# Patient Record
Sex: Female | Born: 1990 | Race: Black or African American | Hispanic: No | Marital: Single | Smoking: Never smoker
Health system: Southern US, Community
[De-identification: ages and names within clinical notes are randomized; demographics above are authoritative.]

## PROBLEM LIST (undated history)

## (undated) DIAGNOSIS — E785 Hyperlipidemia, unspecified: Secondary | ICD-10-CM

## (undated) DIAGNOSIS — K219 Gastro-esophageal reflux disease without esophagitis: Secondary | ICD-10-CM

## (undated) HISTORY — PX: HERNIA REPAIR: SHX51

## (undated) HISTORY — PX: WISDOM TOOTH EXTRACTION: SHX21

---

## 2011-03-28 LAB — ABO/RH: RH Type: POSITIVE

## 2011-03-28 LAB — GC/CHLAMYDIA PROBE AMP, GENITAL: Gonorrhea: POSITIVE

## 2011-03-28 LAB — RPR: RPR: NONREACTIVE

## 2011-03-28 LAB — HEPATITIS B SURFACE ANTIGEN: Hepatitis B Surface Ag: NEGATIVE

## 2011-08-09 NOTE — L&D Delivery Note (Signed)
Delivery Note Pushing started around 1815.  FHR w/ baseline in 150's, recurrent mild to moderate variables noted, and as continued w/ pushing, became moderate variables w/ late component.  Pt tilted from side to side and O2 on via mask.  Meconium appeared more particulate, where had been clear at time of rupture and then MSF when pushing started.  Pt pushed well w/ family at bedside to SVD at 7:03 PM a viable female "[place]" (Presentation: Right Occiput Anterior).  Compound presentation noted w/ newborn's right hand by face and umbilical cord around newborn's rt hand and wrist; reduced before body delivered fully. Newborn slow to transition despite drying and stimulation on mom's abdomen, so cord clamped soon after delivery and cut by pt's father and newborn taken to warmer for further assessment and pulse ox.   APGAR: 7, 8; weight 6 lb 5.2 oz (2870 g).   Placenta status: Intact, Spontaneous, Schultz, trailing membranes.  Cord: 3 vessels with the following complications: None.  Cord pH: 7.24 (venous)  Anesthesia: Epidural  Episiotomy: None Lacerations: 2nd degree;Periurethral;Sulcus Suture Repair: 3-0 monocryl for sulcus and 4-0 monocryl for periurethral Est. Blood Loss (mL):  Mom to postpartum.  Baby to nursery-stable. Pt tolerated repair well.  Encouraged breastfeeding.  Plans outpatient circumcision. Mehmet Scally H 09/01/2011, 7:40 AM

## 2011-08-30 ENCOUNTER — Inpatient Hospital Stay (HOSPITAL_COMMUNITY)
Admission: AD | Admit: 2011-08-30 | Discharge: 2011-09-02 | DRG: 775 | Disposition: A | Discharge status: Home / Self Care | Payer: 59 | Source: Ambulatory Visit | Attending: Obstetrics and Gynecology | Admitting: Obstetrics and Gynecology

## 2011-08-30 ENCOUNTER — Encounter (HOSPITAL_COMMUNITY): Payer: Self-pay

## 2011-08-30 DIAGNOSIS — O480 Post-term pregnancy: Secondary | ICD-10-CM | POA: Diagnosis present

## 2011-08-30 DIAGNOSIS — Z3490 Encounter for supervision of normal pregnancy, unspecified, unspecified trimester: Secondary | ICD-10-CM

## 2011-08-30 DIAGNOSIS — O4100X0 Oligohydramnios, unspecified trimester, not applicable or unspecified: Principal | ICD-10-CM | POA: Diagnosis present

## 2011-08-30 DIAGNOSIS — O328XX0 Maternal care for other malpresentation of fetus, not applicable or unspecified: Secondary | ICD-10-CM | POA: Diagnosis present

## 2011-08-30 HISTORY — DX: Gastro-esophageal reflux disease without esophagitis: K21.9

## 2011-08-30 HISTORY — DX: Hyperlipidemia, unspecified: E78.5

## 2011-08-30 LAB — CBC
Hemoglobin: 12.4 g/dL (ref 12.0–15.0)
MCH: 30.7 pg (ref 26.0–34.0)
MCV: 93.1 fL (ref 78.0–100.0)
Platelets: 241 10*3/uL (ref 150–400)
RBC: 4.04 MIL/uL (ref 3.87–5.11)
WBC: 16.5 10*3/uL — ABNORMAL HIGH (ref 4.0–10.5)

## 2011-08-30 MED ORDER — ZOLPIDEM TARTRATE 10 MG PO TABS: 10.0000 mg | ORAL_TABLET | Freq: Every evening | ORAL | Status: DC | PRN

## 2011-08-30 MED ORDER — IBUPROFEN 600 MG PO TABS: 600.0000 mg | ORAL_TABLET | Freq: Four times a day (QID) | ORAL | Status: DC | PRN

## 2011-08-30 MED ORDER — OXYTOCIN BOLUS FROM INFUSION
500.0000 mL | Freq: Once | INTRAVENOUS | Status: DC
  Filled 2011-08-30: qty 500

## 2011-08-30 MED ORDER — ACETAMINOPHEN 325 MG PO TABS: 650.0000 mg | ORAL_TABLET | ORAL | Status: DC | PRN

## 2011-08-30 MED ORDER — OXYTOCIN 20 UNITS IN LACTATED RINGERS INFUSION - SIMPLE: 125.0000 mL/h | Freq: Once | INTRAVENOUS | Status: DC

## 2011-08-30 MED ORDER — LIDOCAINE HCL (PF) 1 % IJ SOLN
30.0000 mL | INTRAMUSCULAR | Status: DC | PRN
  Filled 2011-08-30: qty 30

## 2011-08-30 MED ORDER — HYDROXYZINE HCL 50 MG PO TABS: 50.0000 mg | ORAL_TABLET | Freq: Four times a day (QID) | ORAL | Status: DC | PRN

## 2011-08-30 MED ORDER — OXYCODONE-ACETAMINOPHEN 5-325 MG PO TABS: 2.0000 | ORAL_TABLET | ORAL | Status: DC | PRN

## 2011-08-30 MED ORDER — SODIUM CHLORIDE 0.9 % IJ SOLN: 3.0000 mL | Freq: Two times a day (BID) | INTRAMUSCULAR | Status: DC

## 2011-08-30 MED ORDER — TERBUTALINE SULFATE 1 MG/ML IJ SOLN: 0.2500 mg | Freq: Once | INTRAMUSCULAR | Status: AC | PRN

## 2011-08-30 MED ORDER — SODIUM CHLORIDE 0.9 % IJ SOLN
3.0000 mL | INTRAMUSCULAR | Status: DC | PRN
  Administered 2011-08-30: 3 mL via INTRAVENOUS

## 2011-08-30 MED ORDER — CITRIC ACID-SODIUM CITRATE 334-500 MG/5ML PO SOLN: 30.0000 mL | ORAL | Status: DC | PRN

## 2011-08-30 MED ORDER — BUTORPHANOL TARTRATE 2 MG/ML IJ SOLN
1.0000 mg | INTRAMUSCULAR | Status: DC | PRN
  Administered 2011-08-31 (×3): 1 mg via INTRAVENOUS
  Filled 2011-08-30 (×3): qty 1

## 2011-08-30 MED ORDER — FLEET ENEMA 7-19 GM/118ML RE ENEM: 1.0000 | ENEMA | RECTAL | Status: DC | PRN

## 2011-08-30 MED ORDER — DINOPROSTONE 10 MG VA INST
10.0000 mg | VAGINAL_INSERT | Freq: Once | VAGINAL | Status: AC
  Administered 2011-08-30: 10 mg via VAGINAL
  Filled 2011-08-30: qty 1

## 2011-08-30 MED ORDER — SODIUM CHLORIDE 0.9 % IV SOLN: 250.0000 mL | INTRAVENOUS | Status: DC | PRN

## 2011-08-30 MED ORDER — LACTATED RINGERS IV SOLN
500.0000 mL | INTRAVENOUS | Status: DC | PRN
  Administered 2011-08-30: 500 mL via INTRAVENOUS
  Administered 2011-08-31: 300 mL via INTRAVENOUS
  Administered 2011-08-31: 1000 mL via INTRAVENOUS

## 2011-08-30 MED ORDER — ONDANSETRON HCL 4 MG/2ML IJ SOLN: 4.0000 mg | Freq: Four times a day (QID) | INTRAMUSCULAR | Status: DC | PRN

## 2011-08-30 MED ORDER — HYDROXYZINE HCL 50 MG/ML IM SOLN: 50.0000 mg | Freq: Four times a day (QID) | INTRAMUSCULAR | Status: DC | PRN

## 2011-08-30 NOTE — Progress Notes (Signed)
Traci Dodson is a 21 y.o. G1P0 at [redacted]w[redacted]d admitted for induction of labor due to Low amniotic fluid..  Subjective: Pt w/o complaints.  No h/o LOF.  No VB.  GFM.  Nml AFI on u/s last week at office.  AFI today=3.7 (<3%) and BPP 6/8 (-2 for decreased fluid).  Family at [facility].    Objective: BP 133/71  Pulse 83  Resp 20  Ht 5\' 11"  (1.803 m)  Wt 95.709 kg (211 lb)  BMI 29.43 kg/m2      FHT:  FHR: 140 bpm, variability: moderate,  accelerations:  Present,  decelerations:  Present occ'l mild variable UC:   Irregular ctxs SVE:   Dilation: 1 Effacement (%): 20 Station: -2 Exam by:: [employer]NC  Labs: No results found for this basename: WBC, HGB, HCT, MCV, PLT    Assessment / Plan: 1.  IOL secondary to oligo at 41.3 weeks  2.  GBS neg  3.  20y.o.  Labor: unfavorable cx w/ Bishop =3-4 Preeclampsia:  no signs or symptoms of toxicity Fetal Wellbeing:  Category I Pain Control:  Labor support without medications I/D:  n/a Anticipated MOD:  NSVD Will proceed w/ Cervidil currently.  Continuous monitoring.  C/w MD prn. Traci Dodson H 08/30/2011, 2:25 PM

## 2011-08-31 ENCOUNTER — Encounter (HOSPITAL_COMMUNITY): Payer: Self-pay | Admitting: Anesthesiology

## 2011-08-31 ENCOUNTER — Inpatient Hospital Stay (HOSPITAL_COMMUNITY): Payer: 59 | Admitting: Anesthesiology

## 2011-08-31 DIAGNOSIS — O98212 Gonorrhea complicating pregnancy, second trimester: Secondary | ICD-10-CM | POA: Insufficient documentation

## 2011-08-31 DIAGNOSIS — O480 Post-term pregnancy: Secondary | ICD-10-CM | POA: Diagnosis present

## 2011-08-31 DIAGNOSIS — Z3490 Encounter for supervision of normal pregnancy, unspecified, unspecified trimester: Secondary | ICD-10-CM

## 2011-08-31 DIAGNOSIS — Z8719 Personal history of other diseases of the digestive system: Secondary | ICD-10-CM | POA: Insufficient documentation

## 2011-08-31 DIAGNOSIS — O4100X0 Oligohydramnios, unspecified trimester, not applicable or unspecified: Secondary | ICD-10-CM | POA: Diagnosis present

## 2011-08-31 LAB — CBC
Hemoglobin: 12.8 g/dL (ref 12.0–15.0)
Platelets: 231 10*3/uL (ref 150–400)
RBC: 4.12 MIL/uL (ref 3.87–5.11)
WBC: 21.7 10*3/uL — ABNORMAL HIGH (ref 4.0–10.5)

## 2011-08-31 MED ORDER — FENTANYL 2.5 MCG/ML BUPIVACAINE 1/10 % EPIDURAL INFUSION (WH - ANES)
14.0000 mL/h | INTRAMUSCULAR | Status: DC
  Filled 2011-08-31: qty 60

## 2011-08-31 MED ORDER — BENZOCAINE-MENTHOL 20-0.5 % EX AERO
INHALATION_SPRAY | CUTANEOUS | Status: AC
  Administered 2011-09-01: 1 via TOPICAL
  Filled 2011-08-31: qty 56

## 2011-08-31 MED ORDER — EPHEDRINE 5 MG/ML INJ
10.0000 mg | INTRAVENOUS | Status: DC | PRN
  Filled 2011-08-31: qty 4

## 2011-08-31 MED ORDER — EPHEDRINE 5 MG/ML INJ: 10.0000 mg | INTRAVENOUS | Status: DC | PRN

## 2011-08-31 MED ORDER — METHYLERGONOVINE MALEATE 0.2 MG PO TABS: 0.2000 mg | ORAL_TABLET | ORAL | Status: DC | PRN

## 2011-08-31 MED ORDER — DIPHENHYDRAMINE HCL 25 MG PO CAPS: 25.0000 mg | ORAL_CAPSULE | Freq: Four times a day (QID) | ORAL | Status: DC | PRN

## 2011-08-31 MED ORDER — METHYLERGONOVINE MALEATE 0.2 MG/ML IJ SOLN: 0.2000 mg | INTRAMUSCULAR | Status: DC | PRN

## 2011-08-31 MED ORDER — OXYTOCIN 20 UNITS IN LACTATED RINGERS INFUSION - SIMPLE
1.0000 m[IU]/min | INTRAVENOUS | Status: DC
  Administered 2011-08-31: 1 m[IU]/min via INTRAVENOUS
  Administered 2011-08-31: 333 m[IU]/min via INTRAVENOUS
  Filled 2011-08-31: qty 1000

## 2011-08-31 MED ORDER — ZOLPIDEM TARTRATE 5 MG PO TABS: 5.0000 mg | ORAL_TABLET | Freq: Every evening | ORAL | Status: DC | PRN

## 2011-08-31 MED ORDER — DIBUCAINE 1 % RE OINT: 1.0000 "application " | TOPICAL_OINTMENT | RECTAL | Status: DC | PRN

## 2011-08-31 MED ORDER — FENTANYL 2.5 MCG/ML BUPIVACAINE 1/10 % EPIDURAL INFUSION (WH - ANES)
INTRAMUSCULAR | Status: DC | PRN
  Administered 2011-08-31: 14 mL/h via EPIDURAL

## 2011-08-31 MED ORDER — MAGNESIUM HYDROXIDE 400 MG/5ML PO SUSP: 30.0000 mL | ORAL | Status: DC | PRN

## 2011-08-31 MED ORDER — LANOLIN HYDROUS EX OINT: TOPICAL_OINTMENT | CUTANEOUS | Status: DC | PRN

## 2011-08-31 MED ORDER — WITCH HAZEL-GLYCERIN EX PADS: 1.0000 "application " | MEDICATED_PAD | CUTANEOUS | Status: DC | PRN

## 2011-08-31 MED ORDER — BENZOCAINE-MENTHOL 20-0.5 % EX AERO
1.0000 "application " | INHALATION_SPRAY | CUTANEOUS | Status: DC | PRN
  Administered 2011-09-01: 1 via TOPICAL

## 2011-08-31 MED ORDER — OXYCODONE-ACETAMINOPHEN 5-325 MG PO TABS: 1.0000 | ORAL_TABLET | ORAL | Status: DC | PRN

## 2011-08-31 MED ORDER — SODIUM BICARBONATE 8.4 % IV SOLN
INTRAVENOUS | Status: DC | PRN
  Administered 2011-08-31: 4 mL via EPIDURAL

## 2011-08-31 MED ORDER — IBUPROFEN 600 MG PO TABS
600.0000 mg | ORAL_TABLET | Freq: Four times a day (QID) | ORAL | Status: DC
  Administered 2011-08-31 – 2011-09-02 (×6): 600 mg via ORAL
  Filled 2011-08-31 (×6): qty 1

## 2011-08-31 MED ORDER — PHENYLEPHRINE 40 MCG/ML (10ML) SYRINGE FOR IV PUSH (FOR BLOOD PRESSURE SUPPORT)
80.0000 ug | PREFILLED_SYRINGE | INTRAVENOUS | Status: DC | PRN
  Filled 2011-08-31: qty 5

## 2011-08-31 MED ORDER — PHENYLEPHRINE 40 MCG/ML (10ML) SYRINGE FOR IV PUSH (FOR BLOOD PRESSURE SUPPORT): 80.0000 ug | PREFILLED_SYRINGE | INTRAVENOUS | Status: DC | PRN

## 2011-08-31 MED ORDER — SODIUM CHLORIDE 0.9 % IV SOLN
3.0000 g | Freq: Once | INTRAVENOUS | Status: AC
  Administered 2011-08-31: 3 g via INTRAVENOUS
  Filled 2011-08-31: qty 3

## 2011-08-31 MED ORDER — DIPHENHYDRAMINE HCL 50 MG/ML IJ SOLN: 12.5000 mg | INTRAMUSCULAR | Status: DC | PRN

## 2011-08-31 MED ORDER — ONDANSETRON HCL 4 MG/2ML IJ SOLN: 4.0000 mg | INTRAMUSCULAR | Status: DC | PRN

## 2011-08-31 MED ORDER — TETANUS-DIPHTH-ACELL PERTUSSIS 5-2.5-18.5 LF-MCG/0.5 IM SUSP: 0.5000 mL | Freq: Once | INTRAMUSCULAR | Status: DC

## 2011-08-31 MED ORDER — ONDANSETRON HCL 4 MG PO TABS: 4.0000 mg | ORAL_TABLET | ORAL | Status: DC | PRN

## 2011-08-31 MED ORDER — SIMETHICONE 80 MG PO CHEW: 80.0000 mg | CHEWABLE_TABLET | ORAL | Status: DC | PRN

## 2011-08-31 MED ORDER — SENNOSIDES-DOCUSATE SODIUM 8.6-50 MG PO TABS
2.0000 | ORAL_TABLET | Freq: Every day | ORAL | Status: DC
  Administered 2011-09-01: 2 via ORAL

## 2011-08-31 MED ORDER — POLYSACCHARIDE IRON 150 MG PO CAPS
150.0000 mg | ORAL_CAPSULE | Freq: Two times a day (BID) | ORAL | Status: DC
  Administered 2011-08-31 – 2011-09-02 (×3): 150 mg via ORAL
  Filled 2011-08-31 (×4): qty 1

## 2011-08-31 MED ORDER — MEDROXYPROGESTERONE ACETATE 150 MG/ML IM SUSP: 150.0000 mg | INTRAMUSCULAR | Status: DC | PRN

## 2011-08-31 MED ORDER — PRENATAL MULTIVITAMIN CH
1.0000 | ORAL_TABLET | Freq: Every day | ORAL | Status: DC
  Administered 2011-09-01 – 2011-09-02 (×2): 1 via ORAL
  Filled 2011-08-31 (×2): qty 1

## 2011-08-31 MED ORDER — LACTATED RINGERS IV SOLN
500.0000 mL | Freq: Once | INTRAVENOUS | Status: AC
  Administered 2011-08-31: 14:00:00 via INTRAVENOUS

## 2011-08-31 MED ORDER — DINOPROSTONE 10 MG VA INST
10.0000 mg | VAGINAL_INSERT | Freq: Once | VAGINAL | Status: AC
  Administered 2011-08-31: 10 mg via VAGINAL
  Filled 2011-08-31: qty 1

## 2011-08-31 NOTE — Anesthesia Preprocedure Evaluation (Signed)
Anesthesia Evaluation  Patient identified by MRN, date of birth, ID band Patient awake    Reviewed: Allergy & Precautions, H&P , Patient's Chart, lab work & pertinent test results  Airway Mallampati: II TM Distance: >3 FB Neck ROM: full    Dental  (+) Teeth Intact   Pulmonary  clear to auscultation        Cardiovascular regular Normal    Neuro/Psych    GI/Hepatic GERD-  ,  Endo/Other    Renal/GU      Musculoskeletal   Abdominal   Peds  Hematology   Anesthesia Other Findings       Reproductive/Obstetrics (+) Pregnancy                           Anesthesia Physical Anesthesia Plan  ASA: II  Anesthesia Plan: Epidural   Post-op Pain Management:    Induction:   Airway Management Planned:   Additional Equipment:   Intra-op Plan:   Post-operative Plan:   Informed Consent: I have reviewed the patients History and Physical, chart, labs and discussed the procedure including the risks, benefits and alternatives for the proposed anesthesia with the patient or authorized representative who has indicated his/her understanding and acceptance.   Dental Advisory Given  Plan Discussed with:   Anesthesia Plan Comments: (Labs checked- platelets confirmed with RN in room. Fetal heart tracing, per RN, reported to be stable enough for sitting procedure. Discussed epidural, and patient consents to the procedure:  included risk of possible headache,backache, failed block, allergic reaction, and nerve injury. This patient was asked if she had any questions or concerns before the procedure started. )        Anesthesia Quick Evaluation  

## 2011-08-31 NOTE — Progress Notes (Signed)
Subjective: Pt has received IV Stadol since 7am.  Denies LOF or VB.  Family remains at Bedside.  Tolerating ctxs well; no labored breathing or grimace currently.  Objective: BP 126/69  Pulse 94  Temp(Src) 98 F (36.7 C) (Oral)  Resp 16  Ht 5\' 11"  (1.803 m)  Wt 95.709 kg (211 lb)  BMI 29.43 kg/m2 I/O last 3 completed shifts: In: 3 [I.V.:3] Out: -     FHT:  FHR: 125 bpm, variability: moderate,  accelerations:  Present,  decelerations:  Present intermittent mild variables; some w/ late component UC:   irregular, every 2-6 minutes; consistent couplet pattern SVE:   Dilation: 3.5 Effacement (%): 90 Station: -2 Exam by:: [employer], cnm Cervidil removed; Membranes bulging  Assessment / Plan: 1.  41.4  2.  Early Labor  3.  GBS neg  4.  Oligo  5.  Int variables  Labor: Cervidil just removed; s/p 2 doses;  Preeclampsia:  no signs or symptoms of toxicity Fetal Wellbeing:  Category II Pain Control:  IV pain med x2 I/D:  n/a Anticipated MOD:  NSVD Will begin IV Pitocin at 38mu/min and titrate by 1 every 30-60 min.   CTO FHT closely. Epidural prn offered to pt. C/w MD prn. Jazline Cumbee H 08/31/2011, 12:29 PM

## 2011-08-31 NOTE — H&P (Signed)
Traci Dodson is a 21 y.o. single black female primagravida presenting for induction of labor at 41.3 weeks per [facility] 08/20/11 secondary to oligohydramnios.  Pt sent from office today after u/s showed AFI=3.77 (<3%) and BPP 6/8 (-2 for Oligo).  Pt had post-term u/s 08/23/11 and AFI at that time=9.49 (25%) and EFW=7+4 (48%).  Grade 3 placenta noted at that time.  Followed by CNM service at [facility].  Pt LTC, w/ onset around 19 weeks; anatomy u/s WNL and AUA consistent w/ LMP.  Pt's pregnancy has been overall unremarkable except positive Gonorrhea cx at time of onset of care and was treated w/ Rocephin.  Pt is not in relationship with FOB.  Lives w/ her mom and her family is very supportive.  Pt is finishing college.  GBS neg on 12/20 and TOC cx's and repeat cx's neg for gc/ct.  Pt denies any recent LOF or abnl d/c.  No VB, UTI, or PIH s/s.  Reports GFM.    Maternal Medical History:  Fetal activity: Perceived fetal activity is normal.   Last perceived fetal movement was within the past hour.    Prenatal complications: 1.  Late to Care 2.  2nd trimester gonorrhea 3.  H/o umb hernia repair as child 4.  Oligo dx'd 08/30/11    OB History    Grav Para Term Preterm Abortions TAB SAB Ect Mult Living   1              Past Medical History  Diagnosis Date  . Hyperlipemia     no meds  . GERD (gastroesophageal reflux disease)    Past Surgical History  Procedure Date  . Wisdom tooth extraction   . Hernia repair     in childhood   Family History: family history is not on file. Social History:  reports that she has never smoked. She does not have any smokeless tobacco history on file. She reports that she does not drink alcohol or use illicit drugs.  Review of Systems  Constitutional: Negative.   HENT: Negative.   Eyes: Negative.   Respiratory: Negative.   Cardiovascular: Negative.   Gastrointestinal: Negative.   Genitourinary: Negative.   Skin: Negative.   Neurological: Negative.     Dilation:  1 Effacement (%): Thick Station: -2 Exam by:: Davis,RN Blood pressure 121/65, pulse 89, temperature 98 F (36.7 C), temperature source Oral, resp. rate 16, height 5\' 11"  (1.803 m), weight 95.709 kg (211 lb). Maternal Exam:  Uterine Assessment: Contraction strength is mild.  Contraction frequency is irregular.   Abdomen: Patient reports no abdominal tenderness. Fetal presentation: vertex  Introitus: Normal vulva. Ferning test: not done.  Nitrazine test: not done.  Pelvis: adequate for delivery.   Cervix: Cervix evaluated by digital exam.     Fetal Exam Fetal Monitor Review: Mode: ultrasound.   Baseline rate: 140.  Variability: moderate (6-25 bpm).   Pattern: accelerations present and variable decelerations.    Fetal State Assessment: Category I - tracings are normal.     Physical Exam  Constitutional: She is oriented to person, place, and time. She appears well-developed and well-nourished. No distress.  Cardiovascular: Normal rate and regular rhythm.   Respiratory: Effort normal and breath sounds normal.  GI: Soft. Bowel sounds are normal.  Genitourinary:       cx on admission=tight 1cm/thick/-1 to -2; mid position membranes palpated  Neurological: She is alert and oriented to person, place, and time. She has normal reflexes.  Skin: Skin is warm and dry.  Prenatal labs: ABO, Rh: A/Positive/-- (08/20 0000) Antibody: Negative (08/20 0000) Rubella: Immune (08/20 0000) RPR: NON REACTIVE (01/22 1450)  HBsAg: Negative (08/20 0000)  HIV: Non-reactive (08/20 1407)  GBS: Negative (01/04 1406)  1hr gtt=126  Assessment/Plan: 1.  IUP at 41.3 2.  Oligohydramnios dx'd today on u/s 3.  GBS neg 4.  Cat I FHT 5.  Rare mild variable 6.  Unfavorable cx  1.  Admit to [facility] w/ Dr. Normand Sloop as attending 2.  Routine L&D orders 3.  Rec'd cervidil for ripening 4.  Pitocin/AROM prn augmentation 5.  C/w MD prn   Sevanna Ballengee H 08/31/2011, 11:12 AM

## 2011-08-31 NOTE — Progress Notes (Signed)
Traci Dodson is a 21 y.o. G1P0 at [redacted]w[redacted]d admitted for induction of labor due to Oligohydramnios.  Subjective: Pt awake watching tv; no breakfast yet.  Pt's mom at bedside.  Feels some ctxs, but no painful and no other c/o's.  Denies LOF, VB.  GFM reported.  2nd cervidil placed around 0230.    Objective: BP 121/65  Pulse 89  Temp(Src) 98 F (36.7 C) (Oral)  Resp 18  Ht 5\' 11"  (1.803 m)  Wt 95.709 kg (211 lb)  BMI 29.43 kg/m2 I/O last 3 completed shifts: In: 3 [I.V.:3] Out: -     FHT:  FHR: 130 bpm, variability: moderate,  accelerations:  Present,  decelerations:  Present occ'l mild variable UC:   irregular, every 2-5 minutes; some couplets/triplets SVE:   Dilation: 1 Effacement (%): Thick Station: -2 Exam by:: Traci Dodson  Labs: Lab Results  Component Value Date   WBC 16.5* 08/30/2011   HGB 12.4 08/30/2011   HCT 37.6 08/30/2011   MCV 93.1 08/30/2011   PLT 241 08/30/2011    Assessment / Plan: 1.  41.4  2.  Induction in progress for Oligo  3.  GBS neg  Labor: latent Preeclampsia:  no signs or symptoms of toxicity Fetal Wellbeing:  Category I Pain Control:  Labor support without medications I/D:  n/a Anticipated MOD:  NSVD 1.  Consider SVE around lunch, and if cx nicely effaced, will d/c Cervidil at that time and proceed w/ Pitocin, if still thick, will leave in place until 1430, and then start Pitocin 2.  Pt ok w/ that plan.   3.  C/w MD prn. Traci Dodson H 08/31/2011, 9:43 AM

## 2011-08-31 NOTE — Progress Notes (Signed)
Asked by pt's RN to call dr. Jean Rosenthal w/ anesthesia r/e pt's WBC.  Called Dr. Jean Rosenthal and discussed pt's status and he verbalized concern w/ Pt's WBC increasing from 16 yesterday to 21.7 today.  He stated he did not feel comfortable placing epidural without prophylactically administering a "broad-spectrum" antibiotic in case pt may potentially have an infection that could more easily travel into blood stream from epidural placement.   Discussed with Dr. Jean Rosenthal that the patient denies any s/s of infection, has been afebrile since admission, denies any resp or GI c/o's.  I did state that she is being induced for oligohydramnios and that if she truly has been leaking, although this hasn't been confirmed because of palpation of membranes on exam and pt denies any h/o leaking, the most plausible explanation if pt did have an infection currently, would be the presence of chorioamnionitis.  After phone conversation with Dr. Jean Rosenthal, consulted with Dr. Pennie Rushing and she rec'd  Unasyn 3gm IV x1 based on Dr. Edison Pace request to treat with antibiotic before agreeing to epidural placement.  In nurse's attendance, along with pt's family at bedside, patient consented for antibiotic therapy x 1 dose, despite no other s/s of infection and that treatment was recommendation of anesthesiologist.  Pt agreeable to proceed and does not have prior medication allergies, but understands potential of allergic reaction with any new presentation.

## 2011-08-31 NOTE — Progress Notes (Signed)
Patient ID: Traci Dodson, female   DOB: [birth year], 21 y.o.   MRN: [id number] .Subjective:  Has been resting, unaware of ctx  Objective: BP 121/59  Pulse 72  Temp(Src) 98.1 F (36.7 C) (Oral)  Resp 20  Ht 5\' 11"  (1.803 m)  Wt 95.709 kg (211 lb)  BMI 29.43 kg/m2   FHT:  FHR: 130 bpm, variability: moderate,  accelerations:  Present,  decelerations:  Present occ variable decels, resolve with position changes UC:   irregular, every 3-6 minutes SVE:   Dilation: 1 Effacement (%): 20 Station: -2 Exam by:: Kiara Keep, CNM    Assessment / Plan: Induction of labor due to oligo/postdates,  Cervical ripening Will place another cervidil and start pitocin later on today PRN   Fetal Wellbeing:  Category I Pain Control:  None needed  Update physician PRN  Malissa Hippo 08/31/2011, 2:59 AM

## 2011-08-31 NOTE — Anesthesia Procedure Notes (Signed)

## 2011-08-31 NOTE — Progress Notes (Signed)
Subjective: Comfortable s/p epidural.  Pitocin on 36mu/min.  Received epidural around 1530.  Parents in waiting room.    Objective: BP 139/84  Pulse 107  Temp(Src) 98.1 F (36.7 C) (Oral)  Resp 18  Ht 5\' 11"  (1.803 m)  Wt 95.709 kg (211 lb)  BMI 29.43 kg/m2 I/O last 3 completed shifts: In: 3 [I.V.:3] Out: -     FHT:  FHR: 140 bpm, variability: moderate,  accelerations:  Abscent,  decelerations:  Present intermittent mild to mod variables; some w/ late component UC:   regular, every 2-5 minutes; some still w/ couplet pattern SVE:   Dilation: 10 Effacement (%): 100 Station: -2 Exam by:: H, Charly Holcomb, cnm Clear LOF from introitus and perineum wet; sm amt of bloody show;  Forebag still noted Labs: Lab Results  Component Value Date   WBC 21.7* 08/31/2011   HGB 12.8 08/31/2011   HCT 38.2 08/31/2011   MCV 92.7 08/31/2011   PLT 231 08/31/2011   Assessment / Plan: 1.  Begin 2nd stage  2.  Now ruptured  3.  No urge to push s/p epidural  4.  GBS neg    Fetal Wellbeing:  Category II Pain Control:  Epidural I/D:  n/a Anticipated MOD:  NSVD Will labor down until urge to push, or 1-2 hrs if FHT WNL. Dr. Pennie Rushing updated.  Pt repositioned after Foley placed on oxygen on via mask. Fortunata Betty H 08/31/2011, 4:53 PM

## 2011-09-01 ENCOUNTER — Encounter (HOSPITAL_COMMUNITY): Payer: Self-pay | Admitting: *Deleted

## 2011-09-01 LAB — CBC
MCH: 31 pg (ref 26.0–34.0)
MCV: 93.5 fL (ref 78.0–100.0)
Platelets: 201 10*3/uL (ref 150–400)
RBC: 3.55 MIL/uL — ABNORMAL LOW (ref 3.87–5.11)
RDW: 13.1 % (ref 11.5–15.5)
WBC: 18.8 10*3/uL — ABNORMAL HIGH (ref 4.0–10.5)

## 2011-09-01 NOTE — Anesthesia Postprocedure Evaluation (Signed)
Anesthesia Post Note  Patient: Traci Dodson  Procedure(s) Performed: * No procedures listed *  Anesthesia type: Epidural  Patient location: Mother/Baby  Post pain: Pain level controlled  Post assessment: Post-op Vital signs reviewed  Last Vitals:  Filed Vitals:   09/01/11 1400  BP: 112/72  Pulse: 96  Temp: 36.8 C  Resp: 18    Post vital signs: Reviewed  Level of consciousness:alert  Complications: No apparent anesthesia complications

## 2011-09-02 MED ORDER — IBUPROFEN 600 MG PO TABS: 600.0000 mg | ORAL_TABLET | Freq: Four times a day (QID) | ORAL | Status: AC | PRN

## 2011-09-02 NOTE — Progress Notes (Addendum)
Post Partum Day 1 Subjective: no complaints.  Doing well, up ad lib.  Breast and bottlefeeding.  No syncope or dizziness.  Undecided regarding contraception.  Objective: Blood pressure 109/72, pulse 90, temperature 97.5 F (36.4 C), temperature source Oral, resp. rate 18, height 5\' 11"  (1.803 m), weight 95.709 kg (211 lb), SpO2 98.00%, unknown if currently breastfeeding.  Physical Exam:  General: alert Lochia: appropriate Uterine Fundus: firm Incision: healing well DVT Evaluation: No evidence of DVT seen on physical exam. Negative Homan's sign.   Basename 09/01/11 0553 08/31/11 1345  HGB 11.0* 12.8  HCT 33.2* 38.2    Assessment/Plan: Plan for discharge tomorrow. Reviewed contraceptive choices.   Nigel Bridgeman, CNM, [place] 09/01/11 10:30am

## 2011-09-02 NOTE — Progress Notes (Signed)
UR Chart review completed.  

## 2011-09-02 NOTE — Discharge Summary (Signed)
Obstetric Discharge Summary Reason for Admission: induction of labor for oligohydramnios and postdates Prenatal Procedures: NST and ultrasound Intrapartum Procedures: spontaneous vaginal delivery Postpartum Procedures: none Complications-Operative and Postpartum: 2nd degree perineal laceration Hemoglobin  Date Value Range Status  09/01/2011 11.0* 12.0-15.0 (g/dL) Final     HCT  Date Value Range Status  09/01/2011 33.2* 36.0-46.0 (%) Final   Hospital Course: Admitted 08/30/11 for induction due to post-dates and oligohydramnios. Negatie GBS. Cervidil was placed x 2, then pitocin was begun, with epidural for pain management.  Progressed  well. Delivery was performed by Denny Levy, CNM, without complication. Patient and baby tolerated the procedure without difficulty, with  2nd degree laceration noted. Infant to FTN. Mother and infant then had an uncomplicated postpartum course, with breast and bottle feeding going well. Mom's physical exam was WNL, and she was discharged home in stable condition. Contraception plan was undecided at the time of discharge.  She received adequate benefit from po pain medications, and was sent home with Ibuprophen.  Discharge Diagnoses: Post-date pregnancy and oligohydramnios, meconium stained fluid  Discharge Information: Date: 09/02/2011 Activity: Per [facility] handout Diet: routine Medications: Ibuprofen Condition: stable Instructions: refer to practice specific booklet Discharge to: home Contraception:  Undecided Follow-up Information    Follow up with [facility] in 6 weeks.         Newborn Data: Live born female  Birth Weight: 6 lb 5.2 oz (2870 g) APGAR: 7, 8  Home with mother.  Traci Dodson 09/02/2011, 7:42 AM

## 2011-10-05 ENCOUNTER — Ambulatory Visit (INDEPENDENT_AMBULATORY_CARE_PROVIDER_SITE_OTHER): Payer: 59 | Admitting: Obstetrics and Gynecology

## 2014-02-04 ENCOUNTER — Emergency Department (HOSPITAL_COMMUNITY)
Admission: EM | Admit: 2014-02-04 | Discharge: 2014-02-04 | Disposition: A | Discharge status: Home / Self Care | Payer: 59 | Attending: Emergency Medicine | Admitting: Emergency Medicine

## 2014-02-04 ENCOUNTER — Encounter (HOSPITAL_COMMUNITY): Payer: Self-pay | Admitting: Emergency Medicine

## 2014-02-04 DIAGNOSIS — Z3202 Encounter for pregnancy test, result negative: Secondary | ICD-10-CM | POA: Insufficient documentation

## 2014-02-04 DIAGNOSIS — R1013 Epigastric pain: Secondary | ICD-10-CM | POA: Insufficient documentation

## 2014-02-04 DIAGNOSIS — E785 Hyperlipidemia, unspecified: Secondary | ICD-10-CM | POA: Insufficient documentation

## 2014-02-04 DIAGNOSIS — F17200 Nicotine dependence, unspecified, uncomplicated: Secondary | ICD-10-CM | POA: Insufficient documentation

## 2014-02-04 DIAGNOSIS — Z8719 Personal history of other diseases of the digestive system: Secondary | ICD-10-CM | POA: Insufficient documentation

## 2014-02-04 DIAGNOSIS — Z79899 Other long term (current) drug therapy: Secondary | ICD-10-CM | POA: Insufficient documentation

## 2014-02-04 LAB — CBC WITH DIFFERENTIAL/PLATELET
Basophils Absolute: 0 10*3/uL (ref 0.0–0.1)
Basophils Relative: 0 % (ref 0–1)
EOS ABS: 0.5 10*3/uL (ref 0.0–0.7)
EOS PCT: 4 % (ref 0–5)
HCT: 39.6 % (ref 36.0–46.0)
HEMOGLOBIN: 13.2 g/dL (ref 12.0–15.0)
LYMPHS ABS: 4.8 10*3/uL — AB (ref 0.7–4.0)
Lymphocytes Relative: 42 % (ref 12–46)
MCH: 30.1 pg (ref 26.0–34.0)
MCHC: 33.3 g/dL (ref 30.0–36.0)
MCV: 90.4 fL (ref 78.0–100.0)
MONOS PCT: 4 % (ref 3–12)
Monocytes Absolute: 0.5 10*3/uL (ref 0.1–1.0)
Neutro Abs: 5.8 10*3/uL (ref 1.7–7.7)
Neutrophils Relative %: 50 % (ref 43–77)
PLATELETS: 342 10*3/uL (ref 150–400)
RBC: 4.38 MIL/uL (ref 3.87–5.11)
RDW: 12.5 % (ref 11.5–15.5)
WBC: 11.5 10*3/uL — ABNORMAL HIGH (ref 4.0–10.5)

## 2014-02-04 LAB — URINE MICROSCOPIC-ADD ON

## 2014-02-04 LAB — COMPREHENSIVE METABOLIC PANEL
ALT: 12 U/L (ref 0–35)
AST: 17 U/L (ref 0–37)
Albumin: 3.8 g/dL (ref 3.5–5.2)
Alkaline Phosphatase: 72 U/L (ref 39–117)
BUN: 11 mg/dL (ref 6–23)
CALCIUM: 9.2 mg/dL (ref 8.4–10.5)
CO2: 23 meq/L (ref 19–32)
CREATININE: 0.71 mg/dL (ref 0.50–1.10)
Chloride: 101 mEq/L (ref 96–112)
GLUCOSE: 117 mg/dL — AB (ref 70–99)
Potassium: 3.8 mEq/L (ref 3.7–5.3)
Sodium: 139 mEq/L (ref 137–147)
Total Bilirubin: 0.2 mg/dL — ABNORMAL LOW (ref 0.3–1.2)
Total Protein: 7.9 g/dL (ref 6.0–8.3)

## 2014-02-04 LAB — URINALYSIS, ROUTINE W REFLEX MICROSCOPIC
Bilirubin Urine: NEGATIVE
Glucose, UA: NEGATIVE mg/dL
Ketones, ur: NEGATIVE mg/dL
NITRITE: NEGATIVE
Protein, ur: NEGATIVE mg/dL
SPECIFIC GRAVITY, URINE: 1.028 (ref 1.005–1.030)
UROBILINOGEN UA: 0.2 mg/dL (ref 0.0–1.0)
pH: 5.5 (ref 5.0–8.0)

## 2014-02-04 LAB — LIPASE, BLOOD: Lipase: 30 U/L (ref 11–59)

## 2014-02-04 LAB — PREGNANCY, URINE: PREG TEST UR: NEGATIVE

## 2014-02-04 MED ORDER — FAMOTIDINE 20 MG PO TABS
20.0000 mg | ORAL_TABLET | Freq: Once | ORAL | Status: AC
  Administered 2014-02-04: 20 mg via ORAL
  Filled 2014-02-04: qty 1

## 2014-02-04 MED ORDER — FAMOTIDINE 20 MG PO TABS: 20.0000 mg | ORAL_TABLET | Freq: Two times a day (BID) | ORAL | Status: DC

## 2014-02-04 MED ORDER — GI COCKTAIL ~~LOC~~
30.0000 mL | Freq: Once | ORAL | Status: AC
  Administered 2014-02-04: 30 mL via ORAL
  Filled 2014-02-04: qty 30

## 2014-02-04 NOTE — ED Notes (Signed)
Patient presents with c/o upper abd pain.  Stated she was on her period and bleed for 4 days.  Then it stopped and came back in 2 days and it was just spotting.  States her pain is in her upper abd area.

## 2014-02-04 NOTE — Discharge Instructions (Signed)
Abdominal Pain, Women °Abdominal (stomach, pelvic, or belly) pain can be caused by many things. It is important to tell your doctor: °· The location of the pain. °· Does it come and go or is it present all the time? °· Are there things that start the pain (eating certain foods, exercise)? °· Are there other symptoms associated with the pain (fever, nausea, vomiting, diarrhea)? °All of this is helpful to know when trying to find the cause of the pain. °CAUSES  °· Stomach: virus or bacteria infection, or ulcer. °· Intestine: appendicitis (inflamed appendix), regional ileitis (Crohn's disease), ulcerative colitis (inflamed colon), irritable bowel syndrome, diverticulitis (inflamed diverticulum of the colon), or cancer of the stomach or intestine. °· Gallbladder disease or stones in the gallbladder. °· Kidney disease, kidney stones, or infection. °· Pancreas infection or cancer. °· Fibromyalgia (pain disorder). °· Diseases of the female organs: °¨ Uterus: fibroid (non-cancerous) tumors or infection. °¨ Fallopian tubes: infection or tubal pregnancy. °¨ Ovary: cysts or tumors. °¨ Pelvic adhesions (scar tissue). °¨ Endometriosis (uterus lining tissue growing in the pelvis and on the pelvic organs). °¨ Pelvic congestion syndrome (female organs filling up with blood just before the menstrual period). °¨ Pain with the menstrual period. °¨ Pain with ovulation (producing an egg). °¨ Pain with an IUD (intrauterine device, birth control) in the uterus. °¨ Cancer of the female organs. °· Functional pain (pain not caused by a disease, may improve without treatment). °· Psychological pain. °· Depression. °DIAGNOSIS  °Your doctor will decide the seriousness of your pain by doing an examination. °· Blood tests. °· X-rays. °· Ultrasound. °· CT scan (computed tomography, special type of X-ray). °· MRI (magnetic resonance imaging). °· Cultures, for infection. °· Barium enema (dye inserted in the large intestine, to better view it with  X-rays). °· Colonoscopy (looking in intestine with a lighted tube). °· Laparoscopy (minor surgery, looking in abdomen with a lighted tube). °· Major abdominal exploratory surgery (looking in abdomen with a large incision). °TREATMENT  °The treatment will depend on the cause of the pain.  °· Many cases can be observed and treated at home. °· Over-the-counter medicines recommended by your caregiver. °· Prescription medicine. °· Antibiotics, for infection. °· Birth control pills, for painful periods or for ovulation pain. °· Hormone treatment, for endometriosis. °· Nerve blocking injections. °· Physical therapy. °· Antidepressants. °· Counseling with a psychologist or psychiatrist. °· Minor or major surgery. °HOME CARE INSTRUCTIONS  °· Do not take laxatives, unless directed by your caregiver. °· Take over-the-counter pain medicine only if ordered by your caregiver. Do not take aspirin because it can cause an upset stomach or bleeding. °· Try a clear liquid diet (broth or water) as ordered by your caregiver. Slowly move to a bland diet, as tolerated, if the pain is related to the stomach or intestine. °· Have a thermometer and take your temperature several times a day, and record it. °· Bed rest and sleep, if it helps the pain. °· Avoid sexual intercourse, if it causes pain. °· Avoid stressful situations. °· Keep your follow-up appointments and tests, as your caregiver orders. °· If the pain does not go away with medicine or surgery, you may try: °¨ Acupuncture. °¨ Relaxation exercises (yoga, meditation). °¨ Group therapy. °¨ Counseling. °SEEK MEDICAL CARE IF:  °· You notice certain foods cause stomach pain. °· Your home care treatment is not helping your pain. °· You need stronger pain medicine. °· You want your IUD removed. °· You feel faint or   lightheaded. °· You develop nausea and vomiting. °· You develop a rash. °· You are having side effects or an allergy to your medicine. °SEEK IMMEDIATE MEDICAL CARE IF:  °· Your  pain does not go away or gets worse. °· You have a fever. °· Your pain is felt only in portions of the abdomen. The right side could possibly be appendicitis. The left lower portion of the abdomen could be colitis or diverticulitis. °· You are passing blood in your stools (bright red or black tarry stools, with or without vomiting). °· You have blood in your urine. °· You develop chills, with or without a fever. °· You pass out. °MAKE SURE YOU:  °· Understand these instructions. °· Will watch your condition. °· Will get help right away if you are not doing well or get worse. °Document Released: 05/22/2007 Document Revised: 10/17/2011 Document Reviewed: 06/11/2009 °[employer]® Patient Information ©2015 [employer]. This information is not intended to replace advice given to you by your health care provider. Make sure you discuss any questions you have with your health care provider. ° °

## 2014-02-04 NOTE — ED Provider Notes (Signed)
CSN: [id number]     Arrival date & time 02/04/14  0004 History   First MD Initiated Contact with Patient 02/04/14 0150     Chief Complaint  Patient presents with  . Abdominal Pain     (Consider location/radiation/quality/duration/timing/severity/associated sxs/prior Treatment) HPI  Patient presents with abdominal pain.  Patient states that she's been having sharp pains in her upper abdomen. They last seconds and come every 1-2 hours. They do not appear to be associated with food. Currently her pain is one out of 10. She reports her pain is in the epigastric region and nonradiating. She's had one episode of nonbloody diarrhea. She denies any nausea or vomiting. She denies any fevers. Last period was 4 days ago.  Past Medical History  Diagnosis Date  . Hyperlipemia     no meds  . GERD (gastroesophageal reflux disease)    Past Surgical History  Procedure Laterality Date  . Wisdom tooth extraction    . Hernia repair      in childhood   History reviewed. No pertinent family history. History  Substance Use Topics  . Smoking status: Current Some Day Smoker  . Smokeless tobacco: Never Used  . Alcohol Use: Yes   OB History   Grav Para Term Preterm Abortions TAB SAB Ect Mult Living   1 1 1       1      Review of Systems  Constitutional: Negative for fever.  Respiratory: Negative for chest tightness and shortness of breath.   Cardiovascular: Negative for chest pain.  Gastrointestinal: Positive for abdominal pain and diarrhea. Negative for nausea and vomiting.  Genitourinary: Negative for dysuria.  Musculoskeletal: Negative for back pain.  All other systems reviewed and are negative.     Allergies  Review of patient's allergies indicates no known allergies.  Home Medications   Prior to Admission medications   Medication Sig Start Date End Date Taking? Authorizing Provider  famotidine (PEPCID) 20 MG tablet Take 1 tablet (20 mg total) by mouth 2 (two) times daily. 02/04/14    Shon Batonourtney F Horton, MD   BP 105/46  Pulse 79  Temp(Src) 97.9 F (36.6 C) (Oral)  Resp 16  Ht 6' (1.829 m)  Wt 206 lb (93.441 kg)  BMI 27.93 kg/m2  SpO2 98%  LMP 01/25/2014  Breastfeeding? No Physical Exam  Nursing note and vitals reviewed. Constitutional: She is oriented to person, place, and time. She appears well-developed and well-nourished. No distress.  HENT:  Head: Normocephalic and atraumatic.  Cardiovascular: Normal rate, regular rhythm and normal heart sounds.   No murmur heard. Pulmonary/Chest: Effort normal and breath sounds normal. No respiratory distress. She has no wheezes.  Abdominal: Soft. Bowel sounds are normal. There is no tenderness. There is no rebound and no guarding.  Neurological: She is alert and oriented to person, place, and time.  Skin: Skin is warm and dry.  Psychiatric: She has a normal mood and affect.    ED Course  Procedures (including critical care time) Labs Review Labs Reviewed  CBC WITH DIFFERENTIAL - Abnormal; Notable for the following:    WBC 11.5 (*)    Lymphs Abs 4.8 (*)    All other components within normal limits  COMPREHENSIVE METABOLIC PANEL - Abnormal; Notable for the following:    Glucose, Bld 117 (*)    Total Bilirubin 0.2 (*)    All other components within normal limits  URINALYSIS, ROUTINE W REFLEX MICROSCOPIC - Abnormal; Notable for the following:    APPearance CLOUDY (*)  Hgb urine dipstick MODERATE (*)    Leukocytes, UA MODERATE (*)    All other components within normal limits  URINE MICROSCOPIC-ADD ON - Abnormal; Notable for the following:    Squamous Epithelial / LPF FEW (*)    Bacteria, UA FEW (*)    All other components within normal limits  LIPASE, BLOOD  PREGNANCY, URINE    Imaging Review No results found.   EKG Interpretation None      MDM   Final diagnoses:  Epigastric abdominal pain    Patient presents with upper abdominal pain. Currently she is well-appearing without significant  tenderness. Vital signs are reassuring. Pain is one out of 10.  Workup reviewed from triage and is largely unremarkable. Mild leukocytosis to 11.5. Patient was given a GI cocktail and Pepcid with complete resolution of pain. Given age and history as well as epigastric pain, feel this may be related to mild gastritis. Have low suspicion for other intra-abdominal pathology at this time and patient continues to be well appearing. Will begin patient on Pepcid at home.  After history, exam, and medical workup I feel the patient has been appropriately medically screened and is safe for discharge home. Pertinent diagnoses were discussed with the patient. Patient was given return precautions.     Shon Batonourtney F Horton, MD 02/04/14 [phone]

## 2014-02-04 NOTE — ED Notes (Signed)
Dressed self, steady gait.

## 2014-02-04 NOTE — ED Notes (Signed)
"  feels better, ready to go", (denies: pain, sob, nausea or dizziness).

## 2014-02-04 NOTE — ED Notes (Signed)
Pt alert, NAD, calm, interactive, no dyspnea noted, texting on smart phone. Updated about wait.

## 2014-06-09 ENCOUNTER — Encounter (HOSPITAL_COMMUNITY): Payer: Self-pay | Admitting: Emergency Medicine

## 2016-01-06 DIAGNOSIS — A749 Chlamydial infection, unspecified: Secondary | ICD-10-CM | POA: Insufficient documentation

## 2021-03-25 ENCOUNTER — Encounter: Payer: Self-pay | Admitting: Internal Medicine

## 2021-04-29 ENCOUNTER — Ambulatory Visit: Payer: 59 | Admitting: Physician Assistant

## 2021-05-24 NOTE — Progress Notes (Signed)
Traci Dodson is a 30 y.o. female here to establish care.   History of Present Illness:   Chief Complaint  Patient presents with  . Annual Exam   Acute Concerns: Abdominal wall hernia -- patient reports that she had abdominal hernia surgery repair when she was a child. Has had issues over the past year or two with worsening abdominal pain and bulge. Denies: nausea, vomiting, inability to reduce hernia.  Heavy periods -- long history of heavy periods x 9 years since the birth of her son. Goes through heavy maxi pads and overnight pads. Has to lay around house for at least two days during her periods due to pain and bleeding. Denies: lightheadedness, dizziness.   Chronic Issues: Acid Reflux Traci Dodson reports that she initially experienced acid reflux when she was pregnant with her now 23 year old son. She reports occasionally experiencing at night more often than during the day. Currently is not concerned with this issue.   Health Maintenance: Immunizations -- COVID- has at least two doses Influenza Vaccine- Never done Mammogram -- N/A PAP -- Due at this time. Bone Density -- N/A Diet -- Admits she is not eating well; intakes a lot of soda and eats all food groups. Caffeine intake -- Multiple cups of soda Sleep habits -- Lately experiencing better sleep. Ophthalmology- Up to date Dentistry- Still attempting to get in with a dentist Exercise -- Currently not exercising; Does want to lose at least 30 lbs. Weight -- Would like to get to 210lbs Wt Readings from Last 3 Encounters:  05/25/21 243 lb 4 oz (110.3 kg)  02/04/14 206 lb (93.4 kg)  08/30/11 211 lb (95.7 kg) (98 %, Z= 2.10)*   * Growth percentiles are based on CDC (Girls, 2-20 Years) data.    Mood -- Stable Alcohol -- Social Drinking Tobacco -- Never  Depression screen PHQ 2/9 05/25/2021  Decreased Interest 0  Down, Depressed, Hopeless 0  PHQ - 2 Score 0    No flowsheet data found.   Other  providers/specialists: Patient Care Team: Jarold Motto, [place] as PCP - General (Physician Assistant)   Past Medical History:  Diagnosis Date  . GERD (gastroesophageal reflux disease)   . Hyperlipemia    no meds  . Vaginal delivery    2013     Social History   Tobacco Use  . Smoking status: Never  . Smokeless tobacco: Never  Vaping Use  . Vaping Use: Never used  Substance Use Topics  . Alcohol use: Yes    Comment: socially  . Drug use: No    Past Surgical History:  Procedure Laterality Date  . HERNIA REPAIR     in childhood  . WISDOM TOOTH EXTRACTION      Family History  Problem Relation Age of Onset  . Diabetes Mother   . Diabetes Maternal Grandmother   . Diabetes Maternal Grandfather   . Hyperlipidemia Paternal Grandmother   . Breast cancer Neg Hx   . Colon cancer Neg Hx     No Known Allergies   Current Medications:  No current outpatient medications on file.   Review of Systems:   Review of Systems  Constitutional:  Negative for chills, fever, malaise/fatigue and weight loss.  HENT:  Negative for hearing loss, sinus pain and sore throat.   Respiratory:  Negative for cough and hemoptysis.   Cardiovascular:  Negative for chest pain, palpitations, leg swelling and PND.  Gastrointestinal:  Negative for abdominal pain, constipation, diarrhea, heartburn, nausea and vomiting.  Genitourinary:  Negative for dysuria, frequency and urgency.  Musculoskeletal:  Negative for back pain, myalgias and neck pain.  Skin:  Negative for itching and rash.  Neurological:  Negative for dizziness, tingling, seizures and headaches.  Endo/Heme/Allergies:  Negative for polydipsia.  Psychiatric/Behavioral:  Negative for depression. The patient is not nervous/anxious.     Vitals:   Vitals:   05/25/21 0800  BP: 110/76  Pulse: 78  Temp: 97.8 F (36.6 C)  TempSrc: Temporal  SpO2: 97%  Weight: 243 lb 4 oz (110.3 kg)  Height: 5' 11.5" (1.816 m)      Body mass index is  33.45 kg/m.  Physical Exam:   Physical Exam Vitals and nursing note reviewed.  Constitutional:      General: She is not in acute distress.    Appearance: Normal appearance. She is well-developed. She is not ill-appearing or toxic-appearing.  HENT:     Head: Normocephalic and atraumatic.     Right Ear: Tympanic membrane, ear canal and external ear normal. Tympanic membrane is not erythematous, retracted or bulging.     Left Ear: Tympanic membrane, ear canal and external ear normal. Tympanic membrane is not erythematous, retracted or bulging.  Eyes:     General: Lids are normal.     Conjunctiva/sclera: Conjunctivae normal.     Pupils: Pupils are equal, round, and reactive to light.  Neck:     Trachea: Trachea normal.  Cardiovascular:     Rate and Rhythm: Normal rate and regular rhythm.     Pulses: Normal pulses.     Heart sounds: Normal heart sounds, S1 normal and S2 normal.     Comments: No LE swelling Pulmonary:     Effort: Pulmonary effort is normal. No tachypnea or respiratory distress.     Breath sounds: Normal breath sounds. No decreased breath sounds, wheezing, rhonchi or rales.  Abdominal:     General: Bowel sounds are normal.     Palpations: Abdomen is soft.     Tenderness: There is no abdominal tenderness.    Genitourinary:    Labia:        Right: No rash, tenderness or lesion.        Left: No rash, tenderness or lesion.      Vagina: Normal. No foreign body. No vaginal discharge, erythema, tenderness or bleeding.     Cervix: No cervical motion tenderness, discharge or friability.     Adnexa:        Right: No mass or tenderness.         Left: No mass or tenderness.    Musculoskeletal:        General: Normal range of motion.     Cervical back: Full passive range of motion without pain.     Comments: Breast exam performed, no acute abnormalities seen on exam  Lymphadenopathy:     Cervical: No cervical adenopathy.  Skin:    General: Skin is warm and dry.   Neurological:     Mental Status: She is alert.     GCS: GCS eye subscore is 4. GCS verbal subscore is 5. GCS motor subscore is 6.     Cranial Nerves: No cranial nerve deficit.     Sensory: No sensory deficit.     Deep Tendon Reflexes: Reflexes are normal and symmetric.  Psychiatric:        Speech: Speech normal.        Behavior: Behavior normal. Behavior is cooperative.      Assessment and Plan:  Encounter for general adult medical examination with abnormal findings Today patient counseled on age appropriate routine health concerns for screening and prevention, each reviewed and up to date or declined. Immunizations reviewed and up to date or declined. Labs ordered and reviewed. Risk factors for depression reviewed and negative. Hearing function and visual acuity are intact. ADLs screened and addressed as needed. Functional ability and level of safety reviewed and appropriate. Education, counseling and referrals performed based on assessed risks today. Patient provided with a copy of personalized plan for preventive services.  Gastroesophageal reflux disease, unspecified whether esophagitis present Overall controlled Consider antacid medication if indicated  Abdominal hernia without obstruction and without gangrene, recurrence not specified, unspecified hernia type No red flags Referral for general surgery placed today Worsening precautions advised in the interim  Obesity, unspecified classification, unspecified obesity type, unspecified whether serious comorbidity present Discussed need for soda reduction Continue to encourage exercise  Menorrhagia with regular cycle Uncontrolled Will obtain pelvic ultrasound Consider COC's  Family history of diabetes mellitus Update A1c -- will provide recommendations accordingly Encouraged reduction of sodas  Pap smear for cervical cancer screening Update today  Encounter for screening for other viral diseases Update Hep C screening  today  I,Havlyn C Ratchford,acting as a scribe for [employer], PA.,have documented all relevant documentation on the behalf of Jarold Motto, PA,as directed by  Jarold Motto, PA while in the presence of Jarold Motto, [place].  I, Jarold Motto, [place], have reviewed all documentation for this visit. The documentation on 05/25/21 for the exam, diagnosis, procedures, and orders are all accurate and complete.   Jarold Motto, PA-C

## 2021-05-25 ENCOUNTER — Ambulatory Visit: Payer: No Typology Code available for payment source | Admitting: Physician Assistant

## 2021-05-25 ENCOUNTER — Other Ambulatory Visit (HOSPITAL_COMMUNITY)
Admission: RE | Admit: 2021-05-25 | Discharge: 2021-05-25 | Disposition: A | Discharge status: Home / Self Care | Payer: No Typology Code available for payment source | Source: Ambulatory Visit | Attending: Physician Assistant | Admitting: Physician Assistant

## 2021-05-25 ENCOUNTER — Other Ambulatory Visit: Payer: Self-pay

## 2021-05-25 ENCOUNTER — Encounter: Payer: Self-pay | Admitting: Physician Assistant

## 2021-05-25 VITALS — BP 110/76 | HR 78 | Temp 97.8°F | Ht 71.5 in | Wt 243.2 lb

## 2021-05-25 DIAGNOSIS — Z1159 Encounter for screening for other viral diseases: Secondary | ICD-10-CM

## 2021-05-25 DIAGNOSIS — Z124 Encounter for screening for malignant neoplasm of cervix: Secondary | ICD-10-CM | POA: Insufficient documentation

## 2021-05-25 DIAGNOSIS — Z833 Family history of diabetes mellitus: Secondary | ICD-10-CM

## 2021-05-25 DIAGNOSIS — Z0001 Encounter for general adult medical examination with abnormal findings: Secondary | ICD-10-CM

## 2021-05-25 DIAGNOSIS — E669 Obesity, unspecified: Secondary | ICD-10-CM

## 2021-05-25 DIAGNOSIS — K219 Gastro-esophageal reflux disease without esophagitis: Secondary | ICD-10-CM | POA: Diagnosis not present

## 2021-05-25 DIAGNOSIS — N920 Excessive and frequent menstruation with regular cycle: Secondary | ICD-10-CM

## 2021-05-25 DIAGNOSIS — K469 Unspecified abdominal hernia without obstruction or gangrene: Secondary | ICD-10-CM

## 2021-05-25 LAB — CBC WITH DIFFERENTIAL/PLATELET
Basophils Absolute: 0.1 10*3/uL (ref 0.0–0.1)
Basophils Relative: 1.1 % (ref 0.0–3.0)
Eosinophils Absolute: 0.3 10*3/uL (ref 0.0–0.7)
Eosinophils Relative: 2.9 % (ref 0.0–5.0)
HCT: 39.8 % (ref 36.0–46.0)
Hemoglobin: 13 g/dL (ref 12.0–15.0)
Lymphocytes Relative: 33.7 % (ref 12.0–46.0)
Lymphs Abs: 3.7 10*3/uL (ref 0.7–4.0)
MCHC: 32.6 g/dL (ref 30.0–36.0)
MCV: 89.1 fl (ref 78.0–100.0)
Monocytes Absolute: 0.6 10*3/uL (ref 0.1–1.0)
Monocytes Relative: 5.9 % (ref 3.0–12.0)
Neutro Abs: 6.1 10*3/uL (ref 1.4–7.7)
Neutrophils Relative %: 56.4 % (ref 43.0–77.0)
Platelets: 372 10*3/uL (ref 150.0–400.0)
RBC: 4.47 Mil/uL (ref 3.87–5.11)
RDW: 12.8 % (ref 11.5–15.5)
WBC: 10.9 10*3/uL — ABNORMAL HIGH (ref 4.0–10.5)

## 2021-05-25 LAB — LIPID PANEL
Cholesterol: 208 mg/dL — ABNORMAL HIGH (ref 0–200)
HDL: 44 mg/dL (ref >39.00)
LDL Cholesterol: 126 mg/dL — ABNORMAL HIGH (ref 0–99)
NonHDL: 164.1
Total CHOL/HDL Ratio: 5
Triglycerides: 193 mg/dL — ABNORMAL HIGH (ref 0.0–149.0)
VLDL: 38.6 mg/dL (ref 0.0–40.0)

## 2021-05-25 LAB — COMPREHENSIVE METABOLIC PANEL
ALT: 12 U/L (ref 0–35)
AST: 18 U/L (ref 0–37)
Albumin: 3.9 g/dL (ref 3.5–5.2)
Alkaline Phosphatase: 58 U/L (ref 39–117)
BUN: 9 mg/dL (ref 6–23)
CO2: 23 mEq/L (ref 19–32)
Calcium: 9.1 mg/dL (ref 8.4–10.5)
Chloride: 104 mEq/L (ref 96–112)
Creatinine, Ser: 0.81 mg/dL (ref 0.40–1.20)
GFR: 98.22 mL/min (ref >60.00)
Glucose, Bld: 87 mg/dL (ref 70–99)
Potassium: 4.3 mEq/L (ref 3.5–5.1)
Sodium: 137 mEq/L (ref 135–145)
Total Bilirubin: 0.3 mg/dL (ref 0.2–1.2)
Total Protein: 7.2 g/dL (ref 6.0–8.3)

## 2021-05-25 LAB — HEMOGLOBIN A1C: Hgb A1c MFr Bld: 5.5 % (ref 4.6–6.5)

## 2021-05-25 NOTE — Patient Instructions (Addendum)
It was great to see you!  Please call [facility] at your convenience to schedule your pelvic ultrasound: [phone]  After you have your ultrasound, we can discuss plan of making your periods better for you  Please call [facility] at your convenience to schedule consultation of your abdominal hernia: [phone]  Please go to the lab for blood work.   Our office will call you with your results unless you have chosen to receive results via MyChart.  If your blood work is normal we will follow-up each year for physicals and as scheduled for chronic medical problems.  If anything is abnormal we will treat accordingly and get you in for a follow-up.  Take care,  Lelon Mast

## 2021-05-26 LAB — IRON,TIBC AND FERRITIN PANEL
%SAT: 22 % (calc) (ref 16–45)
Ferritin: 52 ng/mL (ref 16–154)
Iron: 68 ug/dL (ref 40–190)
TIBC: 306 mcg/dL (calc) (ref 250–450)

## 2021-05-26 LAB — HEPATITIS C ANTIBODY
Hepatitis C Ab: NONREACTIVE
SIGNAL TO CUT-OFF: 0.05 (ref <1.00)

## 2021-05-27 LAB — CYTOLOGY - PAP: Diagnosis: NEGATIVE

## 2021-06-17 ENCOUNTER — Ambulatory Visit
Admission: RE | Admit: 2021-06-17 | Discharge: 2021-06-17 | Disposition: A | Discharge status: Home / Self Care | Payer: 59 | Source: Ambulatory Visit | Attending: Physician Assistant | Admitting: Physician Assistant

## 2021-06-17 DIAGNOSIS — N920 Excessive and frequent menstruation with regular cycle: Secondary | ICD-10-CM

## 2021-06-18 ENCOUNTER — Telehealth: Payer: Self-pay

## 2021-06-18 NOTE — Telephone Encounter (Signed)
Pt called regarding lab results. Please Advise.  

## 2021-06-21 NOTE — Telephone Encounter (Signed)
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

## 2021-06-21 NOTE — Telephone Encounter (Signed)
Left message on voicemail to call office.  

## 2021-06-23 ENCOUNTER — Other Ambulatory Visit: Payer: Self-pay | Admitting: *Deleted

## 2021-06-23 DIAGNOSIS — N8003 Adenomyosis of the uterus: Secondary | ICD-10-CM

## 2021-06-23 DIAGNOSIS — N939 Abnormal uterine and vaginal bleeding, unspecified: Secondary | ICD-10-CM

## 2021-06-23 NOTE — Telephone Encounter (Signed)
See result notes. 

## 2021-06-28 ENCOUNTER — Ambulatory Visit: Payer: 59 | Admitting: Physician Assistant

## 2021-06-30 ENCOUNTER — Other Ambulatory Visit: Payer: Self-pay | Admitting: Surgery

## 2021-06-30 DIAGNOSIS — K439 Ventral hernia without obstruction or gangrene: Secondary | ICD-10-CM

## 2021-07-29 ENCOUNTER — Ambulatory Visit
Admission: RE | Admit: 2021-07-29 | Discharge: 2021-07-29 | Disposition: A | Discharge status: Home / Self Care | Payer: 59 | Source: Ambulatory Visit | Attending: Surgery | Admitting: Surgery

## 2021-07-29 DIAGNOSIS — K439 Ventral hernia without obstruction or gangrene: Secondary | ICD-10-CM

## 2021-07-29 MED ORDER — IOPAMIDOL (ISOVUE-300) INJECTION 61%
100.0000 mL | Freq: Once | INTRAVENOUS | Status: AC | PRN
  Administered 2021-07-29: 11:00:00 100 mL via INTRAVENOUS

## 2021-10-02 ENCOUNTER — Other Ambulatory Visit: Payer: Self-pay

## 2021-10-02 ENCOUNTER — Encounter (HOSPITAL_BASED_OUTPATIENT_CLINIC_OR_DEPARTMENT_OTHER): Payer: Self-pay

## 2021-10-02 ENCOUNTER — Emergency Department (HOSPITAL_BASED_OUTPATIENT_CLINIC_OR_DEPARTMENT_OTHER)
Admission: EM | Admit: 2021-10-02 | Discharge: 2021-10-02 | Disposition: A | Discharge status: Home / Self Care | Payer: No Typology Code available for payment source | Attending: Emergency Medicine | Admitting: Emergency Medicine

## 2021-10-02 DIAGNOSIS — Z20822 Contact with and (suspected) exposure to covid-19: Secondary | ICD-10-CM | POA: Insufficient documentation

## 2021-10-02 DIAGNOSIS — J029 Acute pharyngitis, unspecified: Secondary | ICD-10-CM | POA: Diagnosis present

## 2021-10-02 DIAGNOSIS — R590 Localized enlarged lymph nodes: Secondary | ICD-10-CM | POA: Insufficient documentation

## 2021-10-02 LAB — RESP PANEL BY RT-PCR (FLU A&B, COVID) ARPGX2
Influenza A by PCR: NEGATIVE
Influenza B by PCR: NEGATIVE
SARS Coronavirus 2 by RT PCR: NEGATIVE

## 2021-10-02 LAB — GROUP A STREP BY PCR: Group A Strep by PCR: NOT DETECTED

## 2021-10-02 MED ORDER — DEXAMETHASONE 1 MG/ML PO CONC: 10.0000 mg | Freq: Once | ORAL | Status: DC

## 2021-10-02 MED ORDER — CLINDAMYCIN HCL 150 MG PO CAPS: 300.0000 mg | ORAL_CAPSULE | Freq: Three times a day (TID) | ORAL | 0 refills | Status: AC

## 2021-10-02 MED ORDER — DEXAMETHASONE 4 MG PO TABS
10.0000 mg | ORAL_TABLET | Freq: Once | ORAL | Status: AC
  Administered 2021-10-02: 10 mg via ORAL
  Filled 2021-10-02: qty 3

## 2021-10-02 NOTE — ED Notes (Signed)
Dc by Josh,Paramedic

## 2021-10-02 NOTE — ED Triage Notes (Signed)
Pt arrives POV with c/o sore throat x6 days.  Denies cough, fever, chills, body aches, nausea, vomiting, diarrhea.

## 2021-10-02 NOTE — Discharge Instructions (Addendum)
I have a concern that you have a bacterial infection in your throat. I have given you one dose of steroid here. This will help decrease the inflammation.  I have given you an antibiotic prescription to cover for a false negative strep test.  If you continue to worsen, please return to the ED.

## 2021-10-02 NOTE — ED Provider Notes (Signed)
[facility] EMERGENCY DEPT Provider Note   CSN: [id number] Arrival date & time: 10/02/21  1543     History  Chief Complaint  Patient presents with   Sore Throat    Traci Dodson is a 31 y.o. female. She complains of sore thoart. She states that it started on Sunday and was present for a couple of days. It seemed to improve on it's own, however two days ago, the sore throat started to get worse again. She states that it is painful to swallow. It feels like she has glass in her throat. Denies cough, fever, congestion, or neck swelling   Sore Throat      Home Medications Prior to Admission medications   Medication Sig Start Date End Date Taking? Authorizing Provider  clindamycin (CLEOCIN) 150 MG capsule Take 2 capsules (300 mg total) by mouth 3 (three) times daily for 10 days. 10/02/21 10/12/21 Yes Quantavious Eggert, Finis Bud, PA-C      Allergies    Patient has no known allergies.    Review of Systems   Review of Systems  Constitutional:  Negative for fever.  HENT:  Positive for sore throat. Negative for congestion.   Respiratory:  Negative for cough.   All other systems reviewed and are negative.  Physical Exam Updated Vital Signs BP 136/89    Pulse 78    Temp 98.8 F (37.1 C)    Resp 16    Ht 5\' 11"  (1.803 m)    Wt 108.9 kg    LMP 09/27/2021 (Exact Date)    SpO2 100%    BMI 33.47 kg/m  Physical Exam Vitals and nursing note reviewed.  Constitutional:      General: She is not in acute distress.    Appearance: Normal appearance. She is well-developed. She is not ill-appearing, toxic-appearing or diaphoretic.  HENT:     Head: Normocephalic and atraumatic.     Right Ear: Tympanic membrane and ear canal normal. No drainage, swelling or tenderness. No middle ear effusion. Tympanic membrane is not erythematous.     Left Ear: Tympanic membrane and ear canal normal. No drainage, swelling or tenderness.  No middle ear effusion. Tympanic membrane is not erythematous.      Nose: No nasal deformity, congestion or rhinorrhea.     Mouth/Throat:     Lips: Pink. No lesions.     Mouth: Mucous membranes are moist.     Pharynx: Uvula midline. Oropharyngeal exudate and posterior oropharyngeal erythema present.     Tonsils: Tonsillar exudate present. No tonsillar abscesses. 2+ on the right. 2+ on the left.  Eyes:     General: Gaze aligned appropriately. No scleral icterus.       Right eye: No discharge.        Left eye: No discharge.     Conjunctiva/sclera: Conjunctivae normal.     Right eye: Right conjunctiva is not injected. No exudate or hemorrhage.    Left eye: Left conjunctiva is not injected. No exudate or hemorrhage. Neck:     Comments: No neck swelling Pulmonary:     Effort: Pulmonary effort is normal. No respiratory distress.  Lymphadenopathy:     Cervical: Cervical adenopathy present.  Skin:    General: Skin is warm and dry.  Neurological:     Mental Status: She is alert and oriented to person, place, and time.  Psychiatric:        Mood and Affect: Mood normal.        Speech: Speech normal.  Behavior: Behavior normal. Behavior is cooperative.    ED Results / Procedures / Treatments   Labs (all labs ordered are listed, but only abnormal results are displayed) Labs Reviewed  RESP PANEL BY RT-PCR (FLU A&B, COVID) [id number]  GROUP A STREP BY PCR    EKG None  Radiology No results found.  Procedures Procedures   Medications Ordered in ED Medications  dexamethasone (DECADRON) tablet 10 mg (10 mg Oral Given 10/02/21 1841)    ED Course/ Medical Decision Making/ A&P Clinical Course as of 10/03/21 0047  Sat Oct 02, 2021  1813 Sor ethroat started Sunday. Got better and then came back two days ago. Difficulty swallowing. No fevers. No URI sx.  [GL]    Clinical Course User Index [GL] Demetrick Eichenberger C, PA-C                           Medical Decision Making Problems Addressed: Sore throat: acute illness or injury  Amount and/or  Complexity of Data Reviewed Labs: ordered. Decision-making details documented in ED Course.  Risk Prescription drug management.   This is a 30 y.o. female who presents to the ED with sore thoat for one week.   Initial Impression: Exam is with tonsillar exudates and posterior pharynx erythema. I dont see a PTA. Doubt RPA. Doubt other deep space infection. Plan to order strep and viral panel  Ddx:  Mononucleosis, Peritonsillar Abscess, Retropharyngeal Abscess, Strep throat, and Viral Pharyngitis   Additional History:  Additional history obtained from n/a External records from outside source obtained and reviewed including n/a Social Determinants of Health: n/a    I personally reviewed and interpreted all laboratory work and imaging . I agree with radiologist interpretation. Abnormal results outlined below.  Strep negative, covid/flu negative  Medications:   I ordered medication including decadron  for sore throat Reevaluation of the patient after these medicines showed that the patient stayed the same I have reviewed the patients home medicines and have made adjustments as needed  My Impression: I suspect this could be a bacterial cause. I think false negative strep test is possible. Given patient presentation will tx with abx ppx   Disposition:  After consideration of the diagnostic results and the patients response to treatment, I feel that the patent would benefit from abx.  Portions of this note were generated with Dragon dictation software. Dictation errors may occur despite best attempts at proofreading.   Final Clinical Impression(s) / ED Diagnoses Final diagnoses:  Sore throat    Rx / DC Orders ED Discharge Orders          Ordered    clindamycin (CLEOCIN) 150 MG capsule  3 times daily        02 /25/23 1818              Merryn Thaker, 03-01-2001 10/03/21 0047    10/05/21, MD 10/03/21 1501

## 2021-10-02 NOTE — ED Notes (Signed)
V/S at discharge as noted. Forms reviewed and understood by Pt.

## 2021-10-27 ENCOUNTER — Encounter (HOSPITAL_COMMUNITY): Payer: Self-pay

## 2021-10-27 ENCOUNTER — Encounter (HOSPITAL_COMMUNITY)
Admission: RE | Admit: 2021-10-27 | Discharge: 2021-10-27 | Disposition: A | Discharge status: Home / Self Care | Payer: No Typology Code available for payment source | Source: Ambulatory Visit | Attending: Surgery | Admitting: Surgery

## 2021-10-27 ENCOUNTER — Other Ambulatory Visit: Payer: Self-pay

## 2021-10-27 VITALS — BP 122/86 | HR 82 | Temp 98.4°F | Resp 17 | Ht 71.0 in | Wt 242.5 lb

## 2021-10-27 DIAGNOSIS — Z01812 Encounter for preprocedural laboratory examination: Secondary | ICD-10-CM | POA: Insufficient documentation

## 2021-10-27 DIAGNOSIS — Z01818 Encounter for other preprocedural examination: Secondary | ICD-10-CM

## 2021-10-27 LAB — CBC
HCT: 38.2 % (ref 36.0–46.0)
Hemoglobin: 12.5 g/dL (ref 12.0–15.0)
MCH: 29.8 pg (ref 26.0–34.0)
MCHC: 32.7 g/dL (ref 30.0–36.0)
MCV: 91.2 fL (ref 80.0–100.0)
Platelets: 386 10*3/uL (ref 150–400)
RBC: 4.19 MIL/uL (ref 3.87–5.11)
RDW: 12.4 % (ref 11.5–15.5)
WBC: 10.9 10*3/uL — ABNORMAL HIGH (ref 4.0–10.5)
nRBC: 0 % (ref 0.0–0.2)

## 2021-10-27 NOTE — Progress Notes (Signed)
PCP - Jarold Motto ?Cardiologist - denies ? ?Chest x-ray - n/a ?EKG - n/a ? ?ERAS Protcol - yes, no drink ordered or given ? ? ?COVID TEST- n/a ? ? ?Anesthesia review: n/a ? ?Patient denies shortness of breath, fever, cough and chest pain at PAT appointment ? ? ?All instructions explained to the patient, with a verbal understanding of the material. Patient agrees to go over the instructions while at home for a better understanding. Patient also instructed to self quarantine after being tested for COVID-19. The opportunity to ask questions was provided. ? ? ?

## 2021-10-27 NOTE — Progress Notes (Signed)
Surgical Instructions ? ? ? Your procedure is scheduled on Monday, April 3rd. ? Report to [facility] Main Entrance "A" at 5:30 A.M., then check in with the Admitting office. ? Call this number if you have problems the morning of surgery: ? [phone] ? ? If you have any questions prior to your surgery date call [phone]: Open Monday-Friday 8am-4pm ? ? ? Remember: ? Do not eat after midnight the night before your surgery ? ?You may drink clear liquids until 4:30 AM the morning of your surgery.   ?Clear liquids allowed are: Water, Non-Citrus Juices (without pulp), Carbonated Beverages, Clear Tea, Black Coffee ONLY (NO MILK, CREAM OR POWDERED CREAMER of any kind), and Gatorade ?  ? Take these medicines the morning of surgery with A SIP OF WATER:  ? NONE ? ? ?As of today, STOP taking any Aspirin (unless otherwise instructed by your surgeon) Aleve, Naproxen, Ibuprofen, Motrin, Advil, Goody's, BC's, all herbal medications, fish oil, and all vitamins. ? ?         ?Do not wear jewelry or makeup ?Do not wear lotions, powders, perfumes, or deodorant. ?Do not shave 48 hours prior to surgery.   ?Do not bring valuables to the hospital. ?Do not wear nail polish, gel polish, artificial nails, or any other type of covering on natural nails (fingers and toes) ?If you have artificial nails or gel coating that need to be removed by a nail salon, please have this removed prior to surgery. Artificial nails or gel coating may interfere with anesthesia's ability to adequately monitor your vital signs. ? ?[facility] is not responsible for any belongings or valuables. .  ? ?Do NOT Smoke (Tobacco/Vaping)  24 hours prior to your procedure ? ?If you use a CPAP at night, you may bring your mask for your overnight stay. ?  ?Contacts, glasses, hearing aids, dentures or partials may not be worn into surgery, please bring cases for these belongings ?  ?For patients admitted to the hospital, discharge time will be determined by your treatment  team. ?  ?Patients discharged the day of surgery will not be allowed to drive home, and someone needs to stay with them for 24 hours. ? ? ?SURGICAL WAITING ROOM VISITATION ?Patients having surgery or a procedure in a hospital may have two support people. ?Children under the age of 70 must have an adult with them who is not the patient. ?They may stay in the waiting area during the procedure and may switch out with other visitors. If the patient needs to stay at the hospital during part of their recovery, the visitor guidelines for inpatient rooms apply. ? ?Please refer to the [facility] [place] [facility] website for the visitor guidelines for Inpatients (after your surgery is over and you are in a regular room).  ? ? ?Special instructions:   ? ?Oral Hygiene is also important to reduce your risk of infection.  Remember - BRUSH YOUR TEETH THE MORNING OF SURGERY WITH YOUR REGULAR TOOTHPASTE ? ? ?[facility]- Preparing For Surgery ? ?Before surgery, you can play an important role. Because skin is not sterile, your skin needs to be as free of germs as possible. You can reduce the number of germs on your skin by washing with CHG (chlorahexidine gluconate) Soap before surgery.  CHG is an antiseptic cleaner which kills germs and bonds with the skin to continue killing germs even after washing.   ? ? ?Please do not use if you have an allergy to CHG or antibacterial soaps. If  your skin becomes reddened/irritated stop using the CHG.  ?Do not shave (including legs and underarms) for at least 48 hours prior to first CHG shower. It is OK to shave your face. ? ?Please follow these instructions carefully. ?  ? ? Shower the NIGHT BEFORE SURGERY and the MORNING OF SURGERY with CHG Soap.  ? If you chose to wash your hair, wash your hair first as usual with your normal shampoo. After you shampoo, rinse your hair and body thoroughly to remove the shampoo.  Then [employer] and genitals (private parts) with your normal soap and rinse thoroughly to remove  soap. ? ?After that Use CHG Soap as you would any other liquid soap. You can apply CHG directly to the skin and wash gently with a scrungie or a clean washcloth.  ? ?Apply the CHG Soap to your body ONLY FROM THE NECK DOWN.  Do not use on open wounds or open sores. Avoid contact with your eyes, ears, mouth and genitals (private parts). Wash Face and genitals (private parts)  with your normal soap.  ? ?Wash thoroughly, paying special attention to the area where your surgery will be performed. ? ?Thoroughly rinse your body with warm water from the neck down. ? ?DO NOT shower/wash with your normal soap after using and rinsing off the CHG Soap. ? ?Pat yourself dry with a CLEAN TOWEL. ? ?Wear CLEAN PAJAMAS to bed the night before surgery ? ?Place CLEAN SHEETS on your bed the night before your surgery ? ?DO NOT SLEEP WITH PETS. ? ? ?Day of Surgery: ? ?Take a shower with CHG soap. ?Wear Clean/Comfortable clothing the morning of surgery ?Do not apply any deodorants/lotions.   ?Remember to brush your teeth WITH YOUR REGULAR TOOTHPASTE. ? ? ?If you received a COVID test during your pre-op visit  it is requested that you wear a mask when out in public, stay away from anyone that may not be feeling well and notify your surgeon if you develop symptoms. If you have been in contact with anyone that has tested positive in the last 10 days please notify you surgeon. ? ?  ?Please read over the following fact sheets that you were given.  ? ?

## 2021-11-08 ENCOUNTER — Ambulatory Visit (HOSPITAL_COMMUNITY): Payer: No Typology Code available for payment source | Admitting: Anesthesiology

## 2021-11-08 ENCOUNTER — Ambulatory Visit (HOSPITAL_BASED_OUTPATIENT_CLINIC_OR_DEPARTMENT_OTHER): Payer: No Typology Code available for payment source | Admitting: Anesthesiology

## 2021-11-08 ENCOUNTER — Other Ambulatory Visit: Payer: Self-pay

## 2021-11-08 ENCOUNTER — Encounter (HOSPITAL_COMMUNITY): Payer: Self-pay | Admitting: Surgery

## 2021-11-08 ENCOUNTER — Ambulatory Visit (HOSPITAL_COMMUNITY)
Admission: RE | Admit: 2021-11-08 | Discharge: 2021-11-08 | Disposition: A | Discharge status: Home / Self Care | Payer: No Typology Code available for payment source | Source: Ambulatory Visit | Attending: Surgery | Admitting: Surgery

## 2021-11-08 ENCOUNTER — Encounter (HOSPITAL_COMMUNITY)
Admission: RE | Disposition: A | Discharge status: Home / Self Care | Payer: Self-pay | Source: Ambulatory Visit | Attending: Surgery

## 2021-11-08 DIAGNOSIS — K436 Other and unspecified ventral hernia with obstruction, without gangrene: Secondary | ICD-10-CM

## 2021-11-08 DIAGNOSIS — K219 Gastro-esophageal reflux disease without esophagitis: Secondary | ICD-10-CM | POA: Insufficient documentation

## 2021-11-08 HISTORY — PX: VENTRAL HERNIA REPAIR: SHX424

## 2021-11-08 LAB — POCT PREGNANCY, URINE: Preg Test, Ur: NEGATIVE

## 2021-11-08 SURGERY — REPAIR, HERNIA, VENTRAL, LAPAROSCOPIC
Anesthesia: General

## 2021-11-08 MED ORDER — METHOCARBAMOL 750 MG PO TABS: 750.0000 mg | ORAL_TABLET | Freq: Four times a day (QID) | ORAL | 1 refills | Status: DC

## 2021-11-08 MED ORDER — CHLORHEXIDINE GLUCONATE 0.12 % MT SOLN
15.0000 mL | Freq: Once | OROMUCOSAL | Status: AC
  Administered 2021-11-08: 15 mL via OROMUCOSAL
  Filled 2021-11-08: qty 15

## 2021-11-08 MED ORDER — OXYCODONE HCL 5 MG PO TABS
ORAL_TABLET | ORAL | Status: DC
Start: 2021-11-08 — End: 2021-11-08
  Filled 2021-11-08: qty 1

## 2021-11-08 MED ORDER — FENTANYL CITRATE (PF) 250 MCG/5ML IJ SOLN
INTRAMUSCULAR | Status: DC | PRN
Start: 2021-11-08 — End: 2021-11-08
  Administered 2021-11-08: 50 ug via INTRAVENOUS
  Administered 2021-11-08: 100 ug via INTRAVENOUS
  Administered 2021-11-08 (×2): 50 ug via INTRAVENOUS

## 2021-11-08 MED ORDER — ROCURONIUM BROMIDE 10 MG/ML (PF) SYRINGE
PREFILLED_SYRINGE | INTRAVENOUS | Status: DC | PRN
  Administered 2021-11-08: 10 mg via INTRAVENOUS
  Administered 2021-11-08: 70 mg via INTRAVENOUS

## 2021-11-08 MED ORDER — OXYCODONE HCL 5 MG PO TABS
5.0000 mg | ORAL_TABLET | Freq: Once | ORAL | Status: AC
  Administered 2021-11-08: 5 mg via ORAL

## 2021-11-08 MED ORDER — ONDANSETRON HCL 4 MG/2ML IJ SOLN: 4.0000 mg | Freq: Once | INTRAMUSCULAR | Status: DC | PRN

## 2021-11-08 MED ORDER — HYDROMORPHONE HCL 1 MG/ML IJ SOLN
INTRAMUSCULAR | Status: AC
  Filled 2021-11-08: qty 1

## 2021-11-08 MED ORDER — ROCURONIUM BROMIDE 10 MG/ML (PF) SYRINGE: PREFILLED_SYRINGE | INTRAVENOUS | Status: DC | PRN

## 2021-11-08 MED ORDER — PHENYLEPHRINE 40 MCG/ML (10ML) SYRINGE FOR IV PUSH (FOR BLOOD PRESSURE SUPPORT)
PREFILLED_SYRINGE | INTRAVENOUS | Status: AC
  Filled 2021-11-08: qty 10

## 2021-11-08 MED ORDER — KETOROLAC TROMETHAMINE 30 MG/ML IJ SOLN
30.0000 mg | Freq: Once | INTRAMUSCULAR | Status: AC | PRN
  Administered 2021-11-08: 30 mg via INTRAVENOUS

## 2021-11-08 MED ORDER — PROPOFOL 10 MG/ML IV BOLUS
INTRAVENOUS | Status: AC
  Filled 2021-11-08: qty 20

## 2021-11-08 MED ORDER — DEXAMETHASONE SODIUM PHOSPHATE 10 MG/ML IJ SOLN
INTRAMUSCULAR | Status: AC
  Filled 2021-11-08: qty 1

## 2021-11-08 MED ORDER — LACTATED RINGERS IV SOLN: INTRAVENOUS | Status: DC

## 2021-11-08 MED ORDER — LIDOCAINE 2% (20 MG/ML) 5 ML SYRINGE
INTRAMUSCULAR | Status: DC | PRN
  Administered 2021-11-08: 100 mg via INTRAVENOUS

## 2021-11-08 MED ORDER — ACETAMINOPHEN 10 MG/ML IV SOLN
INTRAVENOUS | Status: DC | PRN
  Administered 2021-11-08: 1000 mg via INTRAVENOUS

## 2021-11-08 MED ORDER — SUGAMMADEX SODIUM 200 MG/2ML IV SOLN
INTRAVENOUS | Status: DC | PRN
  Administered 2021-11-08: 200 mg via INTRAVENOUS

## 2021-11-08 MED ORDER — DOCUSATE SODIUM 100 MG PO CAPS: 100.0000 mg | ORAL_CAPSULE | Freq: Two times a day (BID) | ORAL | 2 refills | Status: AC

## 2021-11-08 MED ORDER — KETOROLAC TROMETHAMINE 30 MG/ML IJ SOLN
INTRAMUSCULAR | Status: AC
  Filled 2021-11-08: qty 1

## 2021-11-08 MED ORDER — MIDAZOLAM HCL 2 MG/2ML IJ SOLN
INTRAMUSCULAR | Status: DC | PRN
  Administered 2021-11-08: 2 mg via INTRAVENOUS

## 2021-11-08 MED ORDER — OXYCODONE HCL 5 MG PO TABS: 5.0000 mg | ORAL_TABLET | ORAL | 0 refills | Status: DC | PRN

## 2021-11-08 MED ORDER — HYDROMORPHONE HCL 1 MG/ML IJ SOLN
0.2500 mg | INTRAMUSCULAR | Status: DC | PRN
  Administered 2021-11-08: 0.25 mg via INTRAVENOUS

## 2021-11-08 MED ORDER — 0.9 % SODIUM CHLORIDE (POUR BTL) OPTIME
TOPICAL | Status: DC | PRN
  Administered 2021-11-08: 1000 mL

## 2021-11-08 MED ORDER — CEFAZOLIN SODIUM-DEXTROSE 2-4 GM/100ML-% IV SOLN
INTRAVENOUS | Status: AC
  Filled 2021-11-08: qty 100

## 2021-11-08 MED ORDER — DROPERIDOL 2.5 MG/ML IJ SOLN
INTRAMUSCULAR | Status: DC | PRN
  Administered 2021-11-08: .625 mg via INTRAVENOUS

## 2021-11-08 MED ORDER — ROCURONIUM BROMIDE 10 MG/ML (PF) SYRINGE
PREFILLED_SYRINGE | INTRAVENOUS | Status: AC
  Filled 2021-11-08: qty 10

## 2021-11-08 MED ORDER — EPHEDRINE 5 MG/ML INJ
INTRAVENOUS | Status: AC
  Filled 2021-11-08: qty 5

## 2021-11-08 MED ORDER — MIDAZOLAM HCL 2 MG/2ML IJ SOLN
INTRAMUSCULAR | Status: AC
  Filled 2021-11-08: qty 2

## 2021-11-08 MED ORDER — ORAL CARE MOUTH RINSE: 15.0000 mL | Freq: Once | OROMUCOSAL | Status: AC

## 2021-11-08 MED ORDER — CEFAZOLIN SODIUM-DEXTROSE 2-3 GM-%(50ML) IV SOLR
INTRAVENOUS | Status: DC | PRN
  Administered 2021-11-08: 2 g via INTRAVENOUS

## 2021-11-08 MED ORDER — LIDOCAINE 2% (20 MG/ML) 5 ML SYRINGE
INTRAMUSCULAR | Status: AC
  Filled 2021-11-08: qty 5

## 2021-11-08 MED ORDER — FENTANYL CITRATE (PF) 250 MCG/5ML IJ SOLN
INTRAMUSCULAR | Status: AC
  Filled 2021-11-08: qty 5

## 2021-11-08 MED ORDER — ACETAMINOPHEN 500 MG PO TABS: 1000.0000 mg | ORAL_TABLET | Freq: Four times a day (QID) | ORAL | 3 refills | Status: AC | PRN

## 2021-11-08 MED ORDER — PROPOFOL 10 MG/ML IV BOLUS
INTRAVENOUS | Status: DC | PRN
  Administered 2021-11-08: 200 mg via INTRAVENOUS
  Administered 2021-11-08: 80 mg via INTRAVENOUS

## 2021-11-08 MED ORDER — DEXAMETHASONE SODIUM PHOSPHATE 10 MG/ML IJ SOLN
INTRAMUSCULAR | Status: DC | PRN
Start: 2021-11-08 — End: 2021-11-08
  Administered 2021-11-08: 10 mg via INTRAVENOUS

## 2021-11-08 MED ORDER — IBUPROFEN 600 MG PO TABS: 600.0000 mg | ORAL_TABLET | Freq: Four times a day (QID) | ORAL | 1 refills | Status: AC | PRN

## 2021-11-08 MED ORDER — PHENYLEPHRINE 40 MCG/ML (10ML) SYRINGE FOR IV PUSH (FOR BLOOD PRESSURE SUPPORT)
PREFILLED_SYRINGE | INTRAVENOUS | Status: DC | PRN
  Administered 2021-11-08: 80 ug via INTRAVENOUS
  Administered 2021-11-08 (×4): 40 ug via INTRAVENOUS
  Administered 2021-11-08 (×2): 80 ug via INTRAVENOUS
  Administered 2021-11-08: 40 ug via INTRAVENOUS
  Administered 2021-11-08: 80 ug via INTRAVENOUS
  Administered 2021-11-08: 40 ug via INTRAVENOUS
  Administered 2021-11-08 (×3): 80 ug via INTRAVENOUS
  Administered 2021-11-08: 40 ug via INTRAVENOUS

## 2021-11-08 MED ORDER — BUPIVACAINE-EPINEPHRINE 0.25% -1:200000 IJ SOLN
INTRAMUSCULAR | Status: DC | PRN
  Administered 2021-11-08: 30 mL

## 2021-11-08 MED ORDER — ONDANSETRON HCL 4 MG/2ML IJ SOLN
INTRAMUSCULAR | Status: AC
  Filled 2021-11-08: qty 2

## 2021-11-08 MED ORDER — ONDANSETRON HCL 4 MG/2ML IJ SOLN
INTRAMUSCULAR | Status: DC | PRN
  Administered 2021-11-08: 4 mg via INTRAVENOUS

## 2021-11-08 MED ORDER — ACETAMINOPHEN 10 MG/ML IV SOLN
INTRAVENOUS | Status: AC
  Filled 2021-11-08: qty 100

## 2021-11-08 MED ORDER — BUPIVACAINE-EPINEPHRINE (PF) 0.25% -1:200000 IJ SOLN
INTRAMUSCULAR | Status: AC
  Filled 2021-11-08: qty 30

## 2021-11-08 SURGICAL SUPPLY — 51 items
ADH SKN CLS APL DERMABOND .7 (GAUZE/BANDAGES/DRESSINGS) ×1
ADH SKN CLS LQ APL DERMABOND (GAUZE/BANDAGES/DRESSINGS) ×1
APL PRP STRL LF DISP 70% ISPRP (MISCELLANEOUS) ×1
BAG COUNTER SPONGE SURGICOUNT (BAG) ×2 IMPLANT
BAG SPNG CNTER NS LX DISP (BAG) ×1
BINDER ABDOMINAL 12 ML 46-62 (SOFTGOODS) IMPLANT
CANISTER SUCT 3000ML PPV (MISCELLANEOUS) IMPLANT
CHLORAPREP W/TINT 26 (MISCELLANEOUS) ×2 IMPLANT
COVER SURGICAL LIGHT HANDLE (MISCELLANEOUS) ×2 IMPLANT
DERMABOND ADHESIVE PROPEN (GAUZE/BANDAGES/DRESSINGS) ×1
DERMABOND ADVANCED (GAUZE/BANDAGES/DRESSINGS) ×1
DERMABOND ADVANCED .7 DNX12 (GAUZE/BANDAGES/DRESSINGS) ×1 IMPLANT
DERMABOND ADVANCED .7 DNX6 (GAUZE/BANDAGES/DRESSINGS) IMPLANT
DEVICE SECURE STRAP 25 ABSORB (INSTRUMENTS) ×3 IMPLANT
ELECT CAUTERY BLADE 6.4 (BLADE) ×2 IMPLANT
ELECT REM PT RETURN 9FT ADLT (ELECTROSURGICAL) ×2
ELECTRODE REM PT RTRN 9FT ADLT (ELECTROSURGICAL) ×1 IMPLANT
GLOVE SURG ENC MOIS LTX SZ6.5 (GLOVE) ×2 IMPLANT
GLOVE SURG UNDER POLY LF SZ6 (GLOVE) ×2 IMPLANT
GOWN STRL REUS W/ TWL LRG LVL3 (GOWN DISPOSABLE) ×3 IMPLANT
GOWN STRL REUS W/TWL LRG LVL3 (GOWN DISPOSABLE) ×6
GRASPER SUT TROCAR 14GX15 (MISCELLANEOUS) ×2 IMPLANT
KIT BASIN OR (CUSTOM PROCEDURE TRAY) ×2 IMPLANT
KIT TURNOVER KIT B (KITS) ×2 IMPLANT
MARKER SKIN DUAL TIP RULER LAB (MISCELLANEOUS) ×2 IMPLANT
MESH VENTRALIGHT ST 4X6IN (Mesh General) ×1 IMPLANT
NDL INSUFFLATION 14GA 120MM (NEEDLE) ×1 IMPLANT
NDL SPNL 22GX3.5 QUINCKE BK (NEEDLE) ×1 IMPLANT
NEEDLE INSUFFLATION 14GA 120MM (NEEDLE) ×2 IMPLANT
NEEDLE SPNL 22GX3.5 QUINCKE BK (NEEDLE) ×2 IMPLANT
NS IRRIG 1000ML POUR BTL (IV SOLUTION) ×2 IMPLANT
PAD ARMBOARD 7.5X6 YLW CONV (MISCELLANEOUS) ×4 IMPLANT
PENCIL BUTTON HOLSTER BLD 10FT (ELECTRODE) ×2 IMPLANT
SCISSORS LAP 5X35 DISP (ENDOMECHANICALS) IMPLANT
SET IRRIG TUBING LAPAROSCOPIC (IRRIGATION / IRRIGATOR) IMPLANT
SET TUBE SMOKE EVAC HIGH FLOW (TUBING) ×2 IMPLANT
SHEARS HARMONIC ACE PLUS 36CM (ENDOMECHANICALS) IMPLANT
SLEEVE ENDOPATH XCEL 5M (ENDOMECHANICALS) ×4 IMPLANT
SUT ETHIBOND 0 MO6 C/R (SUTURE) ×1 IMPLANT
SUT MNCRL AB 4-0 PS2 18 (SUTURE) ×2 IMPLANT
SUT NOVA 1 T20/GS 25DT (SUTURE) ×2 IMPLANT
SUT VIC AB 3-0 SH 27 (SUTURE)
SUT VIC AB 3-0 SH 27XBRD (SUTURE) IMPLANT
TOWEL GREEN STERILE (TOWEL DISPOSABLE) ×2 IMPLANT
TOWEL GREEN STERILE FF (TOWEL DISPOSABLE) ×2 IMPLANT
TRAY FOLEY W/BAG SLVR 16FR (SET/KITS/TRAYS/PACK)
TRAY FOLEY W/BAG SLVR 16FR ST (SET/KITS/TRAYS/PACK) IMPLANT
TRAY LAPAROSCOPIC MC (CUSTOM PROCEDURE TRAY) ×2 IMPLANT
TROCAR XCEL NON-BLD 11X100MML (ENDOMECHANICALS) IMPLANT
TROCAR XCEL NON-BLD 5MMX100MML (ENDOMECHANICALS) ×2 IMPLANT
WATER STERILE IRR 1000ML POUR (IV SOLUTION) ×2 IMPLANT

## 2021-11-08 NOTE — Anesthesia Preprocedure Evaluation (Signed)
Anesthesia Evaluation  ?Patient identified by MRN, date of birth, ID band ?Patient awake ? ? ? ?Reviewed: ?Allergy & Precautions, NPO status , Patient's Chart, lab work & pertinent test results ? ?Airway ?Mallampati: II ? ?TM Distance: >3 FB ?Neck ROM: Full ? ? ? Dental ?no notable dental hx. ? ?  ?Pulmonary ?neg pulmonary ROS,  ?  ?Pulmonary exam normal ?breath sounds clear to auscultation ? ? ? ? ? ? Cardiovascular ?negative cardio ROS ?Normal cardiovascular exam ?Rhythm:Regular Rate:Normal ? ? ?  ?Neuro/Psych ?negative neurological ROS ? negative psych ROS  ? GI/Hepatic ?Neg liver ROS, GERD  ,  ?Endo/Other  ?negative endocrine ROS ? Renal/GU ?negative Renal ROS  ?negative genitourinary ?  ?Musculoskeletal ?negative musculoskeletal ROS ?(+)  ? Abdominal ?  ?Peds ?negative pediatric ROS ?(+)  Hematology ?negative hematology ROS ?(+)   ?Anesthesia Other Findings ? ? Reproductive/Obstetrics ?negative OB ROS ? ?  ? ? ? ? ? ? ? ? ? ? ? ? ? ?  ?  ? ? ? ? ? ? ? ? ?Anesthesia Physical ?Anesthesia Plan ? ?ASA: 2 ? ?Anesthesia Plan: General  ? ?Post-op Pain Management:   ? ?Induction: Intravenous ? ?PONV Risk Score and Plan: 3 and Ondansetron, Dexamethasone, Midazolam, Droperidol and Treatment may vary due to age or medical condition ? ?Airway Management Planned: Oral ETT ? ?Additional Equipment:  ? ?Intra-op Plan:  ? ?Post-operative Plan: Extubation in OR ? ?Informed Consent: I have reviewed the patients History and Physical, chart, labs and discussed the procedure including the risks, benefits and alternatives for the proposed anesthesia with the patient or authorized representative who has indicated his/her understanding and acceptance.  ? ? ? ?Dental advisory given ? ?Plan Discussed with: CRNA and Surgeon ? ?Anesthesia Plan Comments:   ? ? ? ? ? ? ?Anesthesia Quick Evaluation ? ?

## 2021-11-08 NOTE — Transfer of Care (Signed)
Immediate Anesthesia Transfer of Care Note ? ?Patient: Traci Dodson ? ?Procedure(s) Performed: LAPAROSCOPIC VENTRAL HERNIA REPAIR ? ?Patient Location: PACU ? ?Anesthesia Type:General ? ?Level of Consciousness: awake, patient cooperative and responds to stimulation ? ?Airway & Oxygen Therapy: Patient Spontanous Breathing and Patient connected to nasal cannula oxygen ? ?Post-op Assessment: Report given to RN, Post -op Vital signs reviewed and stable and Patient moving all extremities X 4 ? ?Post vital signs: Reviewed and stable ? ?Last Vitals:  ?Vitals Value Taken Time  ?BP 98/64 11/08/21 0909  ?Temp    ?Pulse 109 11/08/21 0909  ?Resp 24 11/08/21 0909  ?SpO2 97 % 11/08/21 0909  ?Vitals shown include unvalidated device data. ? ?Last Pain:  ?Vitals:  ? 11/08/21 0638  ?TempSrc:   ?PainSc: 6   ?   ? ?Patients Stated Pain Goal: 3 (11/08/21 1856) ? ?Complications: No notable events documented. ?
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
## 2021-11-08 NOTE — Anesthesia Procedure Notes (Signed)
Procedure Name: Intubation ?Date/Time: 11/08/2021 7:33 AM ?Performed by: Shary Decamp, CRNA ?Pre-anesthesia Checklist: Patient identified, Patient being monitored, Timeout performed, Emergency Drugs available and Suction available ?Patient Re-evaluated:Patient Re-evaluated prior to induction ?Oxygen Delivery Method: Circle System Utilized ?Preoxygenation: Pre-oxygenation with 100% oxygen ?Induction Type: IV induction ?Ventilation: Mask ventilation without difficulty and Oral airway inserted - appropriate to patient size ?Laryngoscope Size: Hyacinth Meeker and 2 ?Grade View: Grade I ?Tube type: Oral ?Tube size: 7.5 mm ?Number of attempts: 1 ?Airway Equipment and Method: Stylet ?Placement Confirmation: ETT inserted through vocal cords under direct vision, positive ETCO2 and breath sounds checked- equal and bilateral ?Secured at: 22 cm ?Tube secured with: Tape ?Dental Injury: Teeth and Oropharynx as per pre-operative assessment  ? ? ? ? ?

## 2021-11-08 NOTE — Anesthesia Postprocedure Evaluation (Signed)
Anesthesia Post Note ? ?Patient: Traci Dodson ? ?Procedure(s) Performed: LAPAROSCOPIC VENTRAL HERNIA REPAIR ? ?  ? ?Patient location during evaluation: PACU ?Anesthesia Type: General ?Level of consciousness: awake and alert ?Pain management: pain level controlled ?Vital Signs Assessment: post-procedure vital signs reviewed and stable ?Respiratory status: spontaneous breathing, nonlabored ventilation, respiratory function stable and patient connected to nasal cannula oxygen ?Cardiovascular status: blood pressure returned to baseline and stable ?Postop Assessment: no apparent nausea or vomiting ?Anesthetic complications: no ? ? ?No notable events documented. ? ?Last Vitals:  ?Vitals:  ? 11/08/21 0910 11/08/21 0925  ?BP: 98/64 101/63  ?Pulse: (!) 109 (!) 103  ?Resp: (!) 24 20  ?Temp: 36.8 ?C   ?SpO2: 97% 97%  ?  ?Last Pain:  ?Vitals:  ? 11/08/21 0925  ?TempSrc:   ?PainSc: 10-Worst pain ever  ? ? ?  ?  ?  ?  ?  ?  ? ?Cavan Bearden S ? ? ? ? ?

## 2021-11-08 NOTE — Op Note (Signed)
?  ?  Operative Note ?  ?  ?Date:TD@ ?  ?Procedure: laparoscopic ventral hernia repair with mesh ?  ?Pre-op diagnosis: incarcerated ventral hernia ?Post-op diagnosis: same ?  ?Indication and clinical history: 33F with incarcerated ventral hernia   ?  ?Surgeon: [facility], MD ?  ?Anesthesiologist: Okey Dupre, MD ?Anesthesia: General ?  ?Findings:  ?Specimen: none ?EBL: <5cc ?Drains/Implants: 10.2x15.2cm ventralight permanent mesh ?  ?Disposition: PACU - hemodynamically stable. ?  ?Description of procedure: The patient was positioned supine on the operating room table. General anesthetic induction and intubation were uneventful. Time-out was performed verifying correct patient, procedure, signature of informed consent, and administration of pre-operative antibiotics. The abdomen was prepped and draped in the usual sterile fashion.  ?  ?A Veress needle was used in the left upper quadrant to insufflate the abdomen and the abdomen entered using a 98mm port and an optiview technique. No injury to any intra-abdominal structure was identified. Two additional 96mm ports were placed in the left abdomen under direct visualization and after administration of local anesthetic. The incarcerated fat was reduced and the hernia defect closed using a figure of eight zero ethibond suture. A 10.2x15.2cm mesh was labeled in four quadrants, #1 novofil suture secured to it in four quadrants, and was inserted into the abdomen. After orientation in the abdomen, a suture passer was used to secure the mesh in four quadrants and a 6mm absorbable tacker used to circumferentially secure it. The skin of all port sites were closed with 4-0 monocryl suture. All wounds were dressed with dermabond as sterile dressing.  ?  ?All sponge and instrument counts were correct at the conclusion of the procedure. The patient was awakened from anesthesia, extubated uneventfully, and transported to the PACU in good condition. There were no complications.  ?  ?  ?   ?[facility], MD ?General and Trauma Surgery ?[facility]  ?

## 2021-11-08 NOTE — Discharge Instructions (Signed)
May shower beginning 11/09/2021. Do not peel off or scrub skin glue. May allow warm soapy water to run over incision, then rinse and pat dry. Do not soak in any water (tubs, hot tubs, pools, lakes, oceans) for one week.  ? ?No lifting greater than 5 pounds for six weeks. May resume sexual activity when it is comfortable.  ? ?Pain regimen: take over-the-counter tylenol (acetaminophen) 1000mg  every six hours, the prescription ibuprofen (600mg ) every six hours and the robaxin (methocarbamol) 750mg  every six hours. With all three of these, you should be taking something every two hours. Example: tylenol ( acetaminophen) at 8am, ibuprofen at 10am, robaxin (methocarbamol) at 12pm, tylenol (acetaminophen) again at 2pm, ibuprofen again at 4pm, robaxin (methocarbamol) at 6pm. You also have a prescription for oxycodone, which should be taken if the tylenol (acetaminophen), ibuprofen, and robaxin (methocarbamol) are not enough to control your pain. You may take the oxycodone as frequently as every four hours as needed, but if you are taking the other medications as above, you should not need the oxycodone this frequently. You have also been given a prescription for colace (docusate) which is a stool softener. Please take this as prescribed because the oxycodone can cause constipation and the colace (docusate) will minimize or prevent constipation. Do not drive while taking or under the influence of the oxycodone as it is a narcotic medication. ? ?Call the office at [phone] for temperature greater than 101.79F, worsening pain, redness or warmth at the incision site. ? ?Please call [phone] to make an appointment for 2-3 weeks after surgery for wound check.  ? ? ?

## 2021-11-09 ENCOUNTER — Encounter (HOSPITAL_COMMUNITY): Payer: Self-pay | Admitting: Surgery

## 2021-11-09 NOTE — Addendum Note (Signed)
Addendum  created 11/09/21 1440 by Myrtie Soman, MD  ? Intraprocedure Staff edited  ?  ?

## 2021-11-25 DIAGNOSIS — Z9889 Other specified postprocedural states: Secondary | ICD-10-CM | POA: Diagnosis not present

## 2021-11-25 DIAGNOSIS — Z8719 Personal history of other diseases of the digestive system: Secondary | ICD-10-CM | POA: Diagnosis not present

## 2022-05-31 ENCOUNTER — Encounter: Payer: No Typology Code available for payment source | Admitting: Physician Assistant

## 2022-06-02 ENCOUNTER — Encounter: Payer: Self-pay | Admitting: Physician Assistant

## 2022-06-02 ENCOUNTER — Ambulatory Visit (INDEPENDENT_AMBULATORY_CARE_PROVIDER_SITE_OTHER): Payer: 59 | Admitting: Physician Assistant

## 2022-06-02 VITALS — BP 110/70 | HR 76 | Temp 97.5°F | Ht 70.0 in | Wt 240.5 lb

## 2022-06-02 DIAGNOSIS — E669 Obesity, unspecified: Secondary | ICD-10-CM | POA: Diagnosis not present

## 2022-06-02 DIAGNOSIS — N939 Abnormal uterine and vaginal bleeding, unspecified: Secondary | ICD-10-CM | POA: Diagnosis not present

## 2022-06-02 DIAGNOSIS — Z0000 Encounter for general adult medical examination without abnormal findings: Secondary | ICD-10-CM

## 2022-06-02 DIAGNOSIS — N8003 Adenomyosis of the uterus: Secondary | ICD-10-CM

## 2022-06-02 DIAGNOSIS — E785 Hyperlipidemia, unspecified: Secondary | ICD-10-CM | POA: Diagnosis not present

## 2022-06-02 LAB — LIPID PANEL
Cholesterol: 203 mg/dL — ABNORMAL HIGH (ref 0–200)
HDL: 45.2 mg/dL (ref >39.00)
LDL Cholesterol: 130 mg/dL — ABNORMAL HIGH (ref 0–99)
NonHDL: 157.95
Total CHOL/HDL Ratio: 4
Triglycerides: 138 mg/dL (ref 0.0–149.0)
VLDL: 27.6 mg/dL (ref 0.0–40.0)

## 2022-06-02 LAB — CBC WITH DIFFERENTIAL/PLATELET
Basophils Absolute: 0 10*3/uL (ref 0.0–0.1)
Basophils Relative: 0.5 % (ref 0.0–3.0)
Eosinophils Absolute: 0.2 10*3/uL (ref 0.0–0.7)
Eosinophils Relative: 1.8 % (ref 0.0–5.0)
HCT: 39 % (ref 36.0–46.0)
Hemoglobin: 12.8 g/dL (ref 12.0–15.0)
Lymphocytes Relative: 35.8 % (ref 12.0–46.0)
Lymphs Abs: 3.9 10*3/uL (ref 0.7–4.0)
MCHC: 32.7 g/dL (ref 30.0–36.0)
MCV: 89 fl (ref 78.0–100.0)
Monocytes Absolute: 0.6 10*3/uL (ref 0.1–1.0)
Monocytes Relative: 5.5 % (ref 3.0–12.0)
Neutro Abs: 6.1 10*3/uL (ref 1.4–7.7)
Neutrophils Relative %: 56.4 % (ref 43.0–77.0)
Platelets: 371 10*3/uL (ref 150.0–400.0)
RBC: 4.38 Mil/uL (ref 3.87–5.11)
RDW: 13.1 % (ref 11.5–15.5)
WBC: 10.8 10*3/uL — ABNORMAL HIGH (ref 4.0–10.5)

## 2022-06-02 LAB — COMPREHENSIVE METABOLIC PANEL
ALT: 15 U/L (ref 0–35)
AST: 18 U/L (ref 0–37)
Albumin: 4 g/dL (ref 3.5–5.2)
Alkaline Phosphatase: 52 U/L (ref 39–117)
BUN: 12 mg/dL (ref 6–23)
CO2: 25 mEq/L (ref 19–32)
Calcium: 9.3 mg/dL (ref 8.4–10.5)
Chloride: 105 mEq/L (ref 96–112)
Creatinine, Ser: 0.82 mg/dL (ref 0.40–1.20)
GFR: 96.09 mL/min (ref >60.00)
Glucose, Bld: 82 mg/dL (ref 70–99)
Potassium: 4.2 mEq/L (ref 3.5–5.1)
Sodium: 138 mEq/L (ref 135–145)
Total Bilirubin: 0.7 mg/dL (ref 0.2–1.2)
Total Protein: 7.1 g/dL (ref 6.0–8.3)

## 2022-06-02 NOTE — Patient Instructions (Addendum)
It was great to see you!  Please try to see a dentist for routine cleanings I will place referral for gynecology Please try to find time for exercise -- walking counts!  Please go to the lab for blood work.   Our office will call you with your results unless you have chosen to receive results via MyChart.  If your blood work is normal we will follow-up each year for physicals and as scheduled for chronic medical problems.  If anything is abnormal we will treat accordingly and get you in for a follow-up.  Take care,  Aldona Bar

## 2022-06-02 NOTE — Progress Notes (Signed)
Subjective:    Traci Dodson is a 31 y.o. female and is here for a comprehensive physical exam.  HPI    She denies any problems except acid reflux when she was pregnant.   There are no preventive care reminders to display for this patient.   Acute Concerns: None  Chronic Issues: Cholesterol: She notes that her cholesterol was slightly elevated. She does not need medication for cholesterol as of right now.  Lab Results  Component Value Date   CHOL 208 (H) 05/25/2021   HDL 44.00 05/25/2021   LDLCALC 126 (H) 05/25/2021   TRIG 193.0 (H) 05/25/2021   CHOLHDL 5 05/25/2021   Health Maintenance: Social History -- Both of her parents have diabetes. She denies any cancer in her family. She denies drinking alcohol.  PAP -- Last completed on 05/25/2021.  Diet -- She notes she does not maintain healthy diet.   Exercise -- She has not been exercising.   Sleep habits -- She has been sleeping well and getting full-night of sleep.  Mood -- She reports her mood has been stable.   UTD with dentist? - She is not UTD with dentist.  UTD with eye doctor? - She is UTD with eye doctor.   Weight history: Wt Readings from Last 10 Encounters:  06/02/22 240 lb 8 oz (109.1 kg)  11/08/21 232 lb (105.2 kg)  10/27/21 242 lb 8 oz (110 kg)  10/02/21 240 lb (108.9 kg)  05/25/21 243 lb 4 oz (110.3 kg)  02/04/14 206 lb (93.4 kg)  08/30/11 211 lb (95.7 kg) (98 %, Z= 2.10)*   * Growth percentiles are based on CDC (Girls, 2-20 Years) data.   Body mass index is 34.51 kg/m. Patient's last menstrual period was 05/30/2022 (exact date). Continues to have heavy periods.  Alcohol use:  reports current alcohol use.  Tobacco use:  Tobacco Use: Low Risk  (06/02/2022)   Patient History    Smoking Tobacco Use: Never    Smokeless Tobacco Use: Never    Passive Exposure: Not on file   Eligible for lung cancer screening? no     06/02/2022    9:36 AM  Depression screen PHQ 2/9  Decreased Interest 0   Down, Depressed, Hopeless 0  PHQ - 2 Score 0     Other providers/specialists: Patient Care Team: Jarold Motto, [place] as PCP - General (Physician Assistant)    PMHx, SurgHx, SocialHx, Medications, and Allergies were reviewed in the Visit Navigator and updated as appropriate.   Past Medical History:  Diagnosis Date   GERD (gastroesophageal reflux disease)    Hyperlipemia    no meds   Vaginal delivery    2013     Past Surgical History:  Procedure Laterality Date   HERNIA REPAIR     in childhood   VENTRAL HERNIA REPAIR N/A 11/08/2021   Procedure: LAPAROSCOPIC VENTRAL HERNIA REPAIR;  Surgeon: Diamantina Monks, MD;  Location: [facility] OR;  Service: General;  Laterality: N/A;   WISDOM TOOTH EXTRACTION       Family History  Problem Relation Age of Onset   Diabetes Mother    Diabetes Maternal Grandmother    Diabetes Maternal Grandfather    Hyperlipidemia Paternal Grandmother    Breast cancer Neg Hx    Colon cancer Neg Hx     Social History   Tobacco Use   Smoking status: Never   Smokeless tobacco: Never  Vaping Use   Vaping Use: Never used  Substance Use Topics  Alcohol use: Yes    Comment: socially   Drug use: No    Review of Systems:   Review of Systems  Constitutional:  Negative for chills, fever, malaise/fatigue and weight loss.  HENT:  Negative for hearing loss, sinus pain and sore throat.   Respiratory:  Negative for cough and hemoptysis.   Cardiovascular:  Negative for chest pain, palpitations, orthopnea, leg swelling and PND.  Gastrointestinal:  Negative for abdominal pain, constipation, diarrhea, heartburn, nausea and vomiting.  Genitourinary:  Negative for dysuria, frequency and urgency.  Musculoskeletal:  Negative for back pain, myalgias and neck pain.  Skin:  Negative for itching and rash.  Endo/Heme/Allergies:  Negative for polydipsia.  Psychiatric/Behavioral:  Negative for depression. The patient is not nervous/anxious.     Objective:   BP  110/70 (BP Location: Left Arm, Patient Position: Sitting, Cuff Size: Large)   Pulse 76   Temp (!) 97.5 F (36.4 C) (Temporal)   Ht 5\' 10"  (1.778 m)   Wt 240 lb 8 oz (109.1 kg)   LMP 05/30/2022 (Exact Date)   BMI 34.51 kg/m  Body mass index is 34.51 kg/m.   General Appearance:    Alert, cooperative, no distress, appears stated age  Head:    Normocephalic, without obvious abnormality, atraumatic  Eyes:    PERRL, conjunctiva/corneas clear, EOM's intact, fundi    benign, both eyes  Ears:    Normal TM's and external ear canals, both ears  Nose:   Nares normal, septum midline, mucosa normal, no drainage    or sinus tenderness  Throat:   Lips, mucosa, and tongue normal; teeth and gums normal  Neck:   Supple, symmetrical, trachea midline, no adenopathy;    thyroid:  no enlargement/tenderness/nodules; no carotid   bruit or JVD  Back:     Symmetric, no curvature, ROM normal, no CVA tenderness  Lungs:     Clear to auscultation bilaterally, respirations unlabored  Chest Wall:    No tenderness or deformity   Heart:    Regular rate and rhythm, S1 and S2 normal, no murmur, rub or gallop  Breast Exam:    Deferred   Abdomen:     Soft, non-tender, bowel sounds active all four quadrants,    no masses, no organomegaly  Genitalia:    Deferred   Extremities:   Extremities normal, atraumatic, no cyanosis or edema  Pulses:   2+ and symmetric all extremities  Skin:   Skin color, texture, turgor normal, no rashes or lesions  Lymph nodes:   Cervical, supraclavicular, and axillary nodes normal  Neurologic:   CNII-XII intact, normal strength, sensation and reflexes    throughout    Assessment/Plan:   Routine physical examination Today patient counseled on age appropriate routine health concerns for screening and prevention, each reviewed and up to date or declined. Immunizations reviewed and up to date or declined. Labs ordered and reviewed. Risk factors for depression reviewed and negative. Hearing  function and visual acuity are intact. ADLs screened and addressed as needed. Functional ability and level of safety reviewed and appropriate. Education, counseling and referrals performed based on assessed risks today. Patient provided with a copy of personalized plan for preventive services.  Abnormal uterine bleeding due to adenomyosis Referral to gyn  Hyperlipidemia, unspecified hyperlipidemia type Update lipid panel and make recommendations accordingly  Obesity, unspecified classification, unspecified obesity type, unspecified whether serious comorbidity present Continue efforts at healthy lifestyle   I,Param Shah,acting as a scribe for 06/01/2022, PA.,have documented all relevant  documentation on the behalf of Inda Coke, PA,as directed by  Inda Coke, PA while in the presence of Inda Coke, [place].  I, Inda Coke, [place], have reviewed all documentation for this visit. The documentation on 06/02/22 for the exam, diagnosis, procedures, and orders are all accurate and complete.  Inda Coke, PA-C [place]

## 2022-07-07 ENCOUNTER — Encounter: Payer: Self-pay | Admitting: Nurse Practitioner

## 2022-07-07 ENCOUNTER — Ambulatory Visit: Payer: 59 | Admitting: Nurse Practitioner

## 2022-07-07 VITALS — Resp 16 | Ht 71.0 in | Wt 242.0 lb

## 2022-07-07 DIAGNOSIS — N946 Dysmenorrhea, unspecified: Secondary | ICD-10-CM

## 2022-07-07 DIAGNOSIS — N920 Excessive and frequent menstruation with regular cycle: Secondary | ICD-10-CM | POA: Diagnosis not present

## 2022-07-07 MED ORDER — NORETHIN ACE-ETH ESTRAD-FE 1-20 MG-MCG PO TABS: 1.0000 | ORAL_TABLET | Freq: Every day | ORAL | 1 refills | Status: DC

## 2022-07-07 NOTE — Progress Notes (Signed)
   Acute Office Visit  Subjective:    Patient ID: Traci Dodson, female    DOB: 1991/02/07, 31 y.o.   MRN: [id number]   HPI 31 y.o. G1P1001 presents today for menorrhagia. Referred by PCP. Menarche at age 47. Cycles have always been heavy but pain has worsened. Cycles occur monthly, heaviest on day 2 and 3, requiring changing of pads every few hours, she also doubles up due to inability to change them often at work. Total bleeding time of 4-5 days. Possible adenomyosis seen on pelvic ultrasound 06/2021. Sexually active. UTD on pap, normal history.   LMP 06/23/2022 Period Cycle (Days): 28 Period Duration (Days): 4-5 Period Pattern: Regular Menstrual Flow: Heavy Menstrual Control: Maxi pad Menstrual Control Change Freq (Hours): wears 2 pads and changes 4-5x a day Dysmenorrhea: (!) Moderate Dysmenorrhea Symptoms: Other (Comment) (lower back pain)   Review of Systems  Constitutional: Negative.   Genitourinary:  Positive for menstrual problem.       Objective:    Physical Exam Constitutional:      Appearance: Normal appearance.   GU: Deferred  Resp 16   Ht 5\' 11"  (1.803 m)   Wt 242 lb (109.8 kg)   LMP 06/23/2022   BMI 33.75 kg/m  Wt Readings from Last 3 Encounters:  07/07/22 242 lb (109.8 kg)  06/02/22 240 lb 8 oz (109.1 kg)  11/08/21 232 lb (105.2 kg)        Assessment & Plan:   Problem List Items Addressed This Visit   None Visit Diagnoses     Menorrhagia with regular cycle    -  Primary   Relevant Medications   norethindrone-ethinyl estradiol-FE (LOESTRIN FE) 1-20 MG-MCG tablet   Dysmenorrhea       Relevant Medications   norethindrone-ethinyl estradiol-FE (LOESTRIN FE) 1-20 MG-MCG tablet      Plan: Discussed adenomyosis and causes for pain during menses. Discussed management with Ibuprofen, lysteda or hormonal contraception. She would like to start on birth control. Educated on different methods. COCs preferred. Educated on proper use and will start with  next menses. Also recommend Ibuprofen for pain and to decrease bleeding by up to 40% as needed. All questions answered.      01/08/22 DNP, 10:22 AM 07/07/2022

## 2022-10-04 DIAGNOSIS — J029 Acute pharyngitis, unspecified: Secondary | ICD-10-CM | POA: Diagnosis not present

## 2022-10-04 DIAGNOSIS — J020 Streptococcal pharyngitis: Secondary | ICD-10-CM | POA: Diagnosis not present

## 2022-11-01 IMAGING — US US PELVIS COMPLETE WITH TRANSVAGINAL
2 series · 13 of 25 positions shown · non-contrast
Comparison: None

CLINICAL DATA: Menorrhagia with regular cycle, LMP 06/11/2021



[Series 1: us pelvis complete with transvaginal · 0.23mm/px · 5 of 38 slices shown (1 of 2)]
[im 1/38]
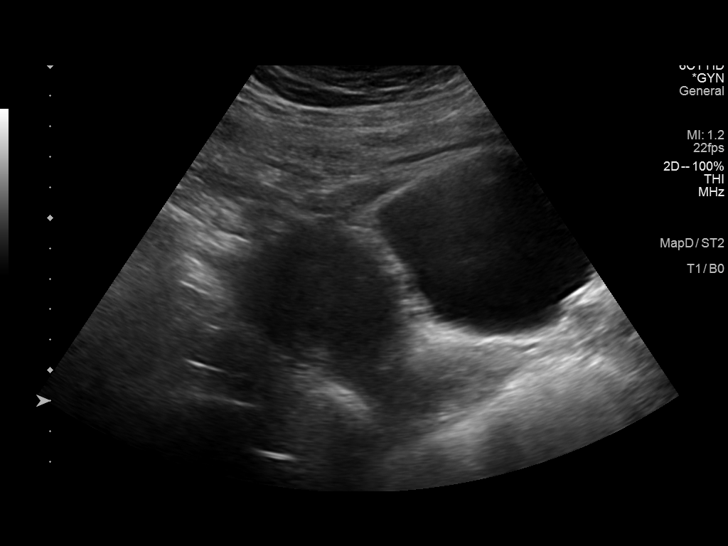
[im 10/38]
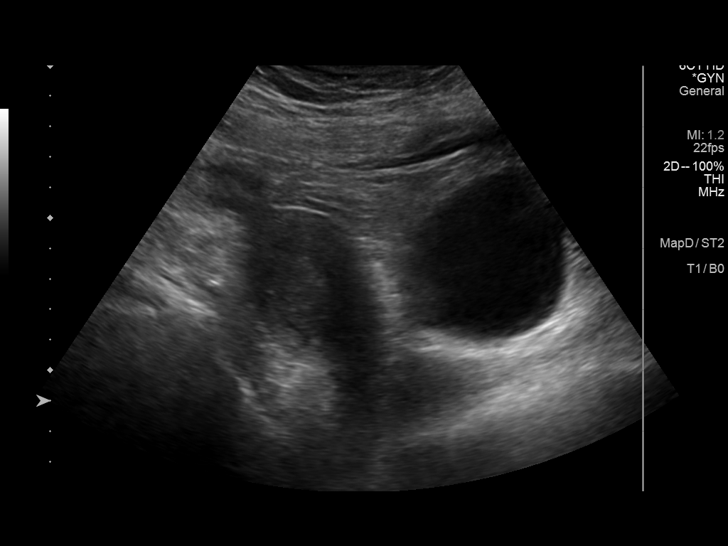
[im 19/38]
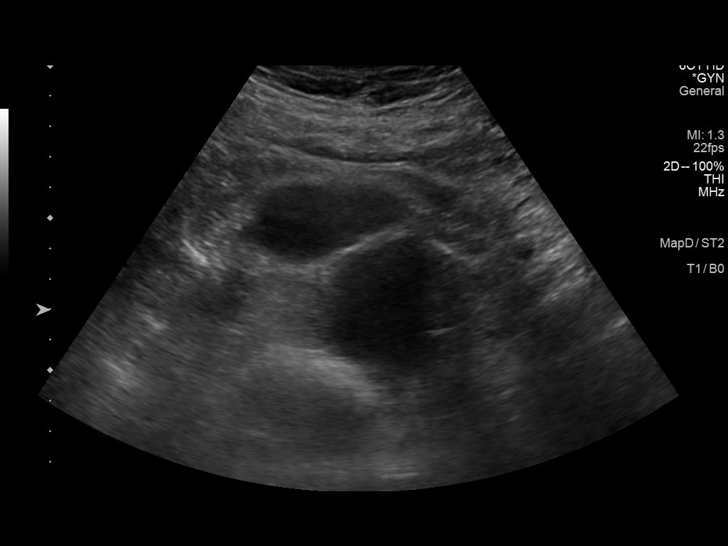
[im 28/38]
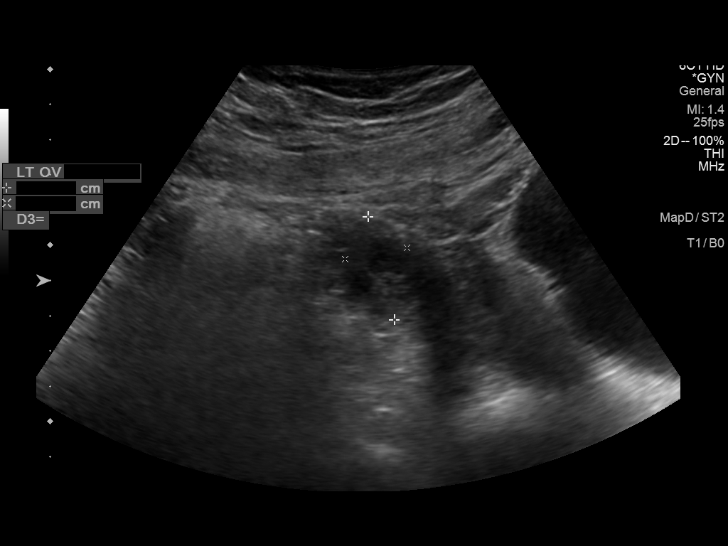
[im 38/38]
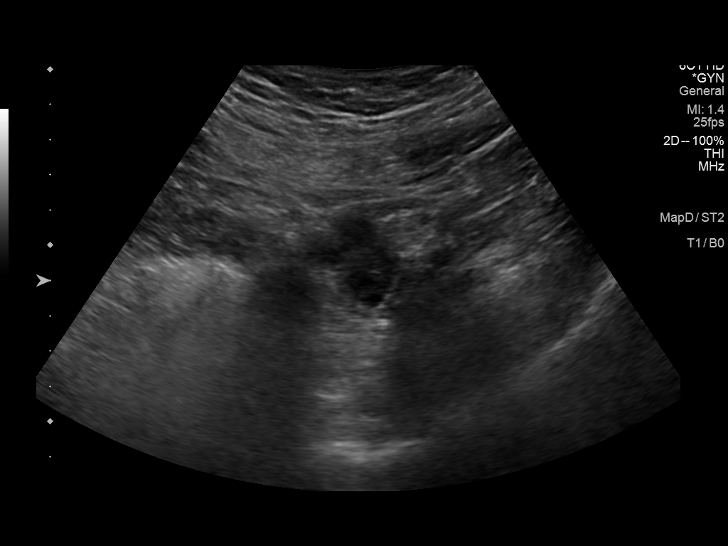

[Series 1001: us pelvis complete with transvaginal · 0.11mm/px · 8 of 70 slices shown (2 of 2)]
[im 5/70]
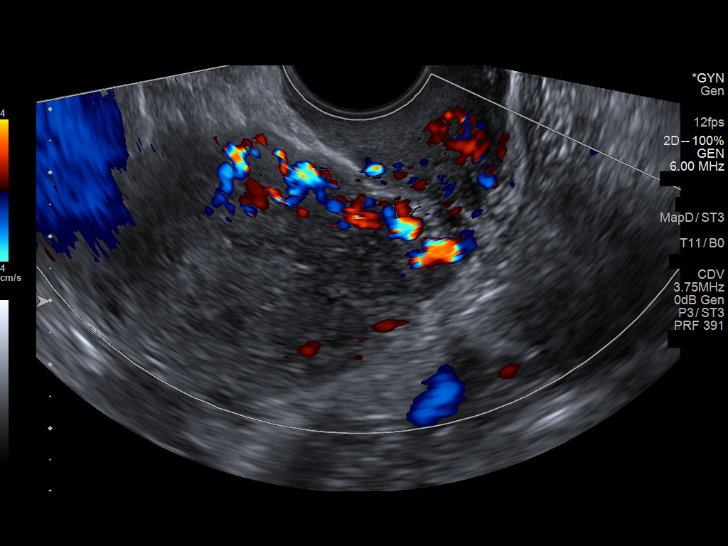
[im 14/70]
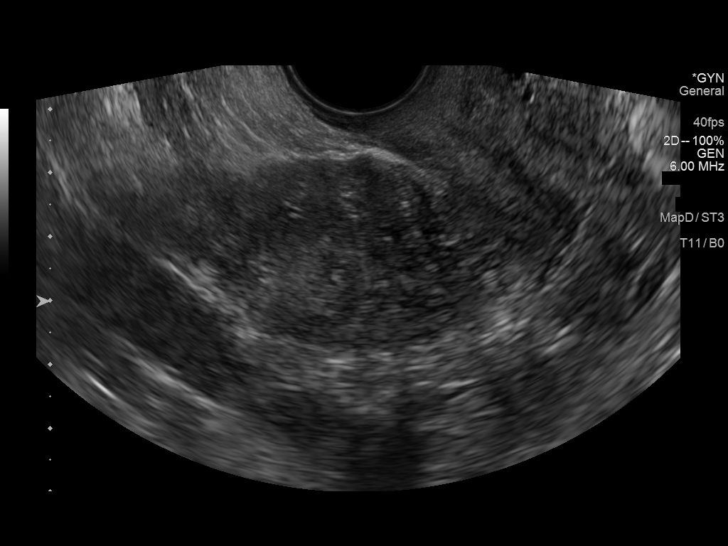
[im 24/70]
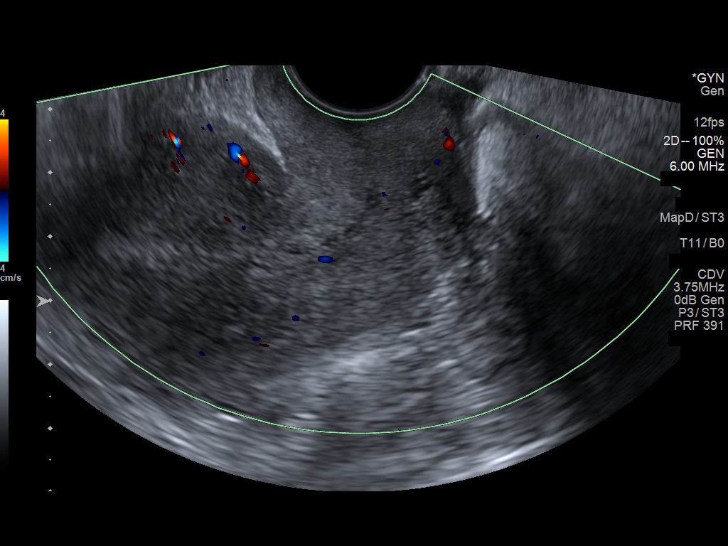
[im 33/70]
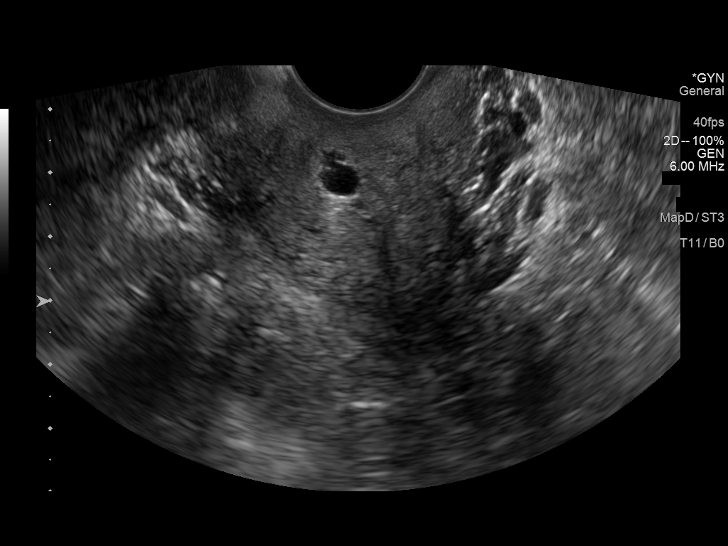
[im 42/70]
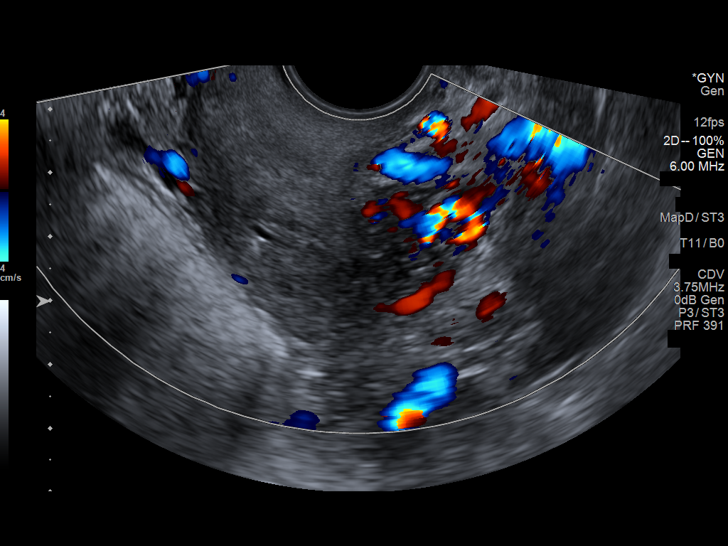
[im 51/70]
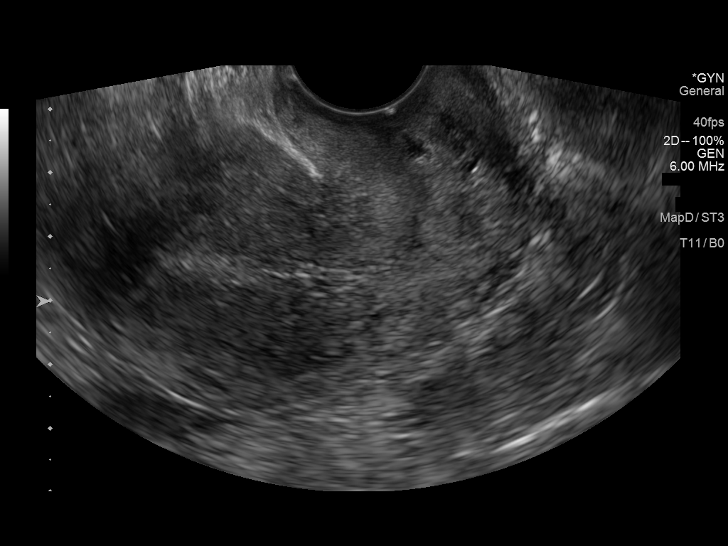
[im 60/70]
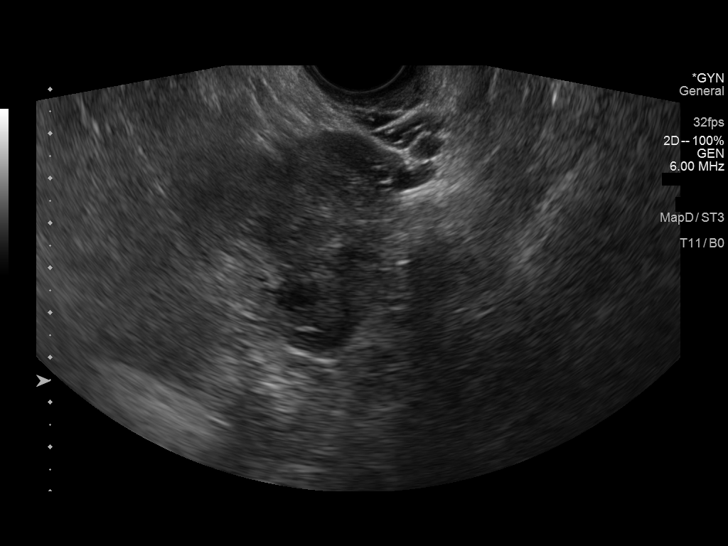
[im 70/70]
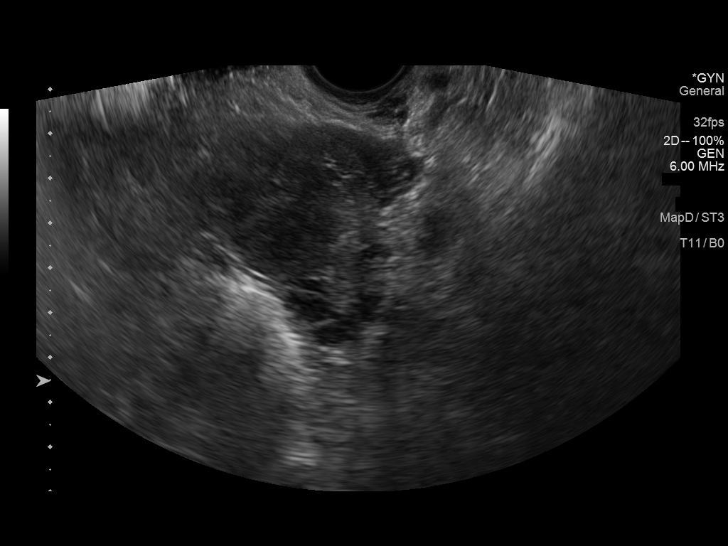

[13 of 25 positions shown; findings below may reference images not displayed]

FINDINGS: Uterus

Measurements: 8.4 x 5.0 x 5.5 cm = volume: 120 mL. Diffusely
heterogeneous myometrial echogenicity. Slightly ill-defined
endometrial complex. Adenomyosis not excluded in this setting. No
discrete myometrial mass. Nabothian cysts at cervix.

Endometrium

Thickness: 6 mm.  No endometrial fluid or mass

Right ovary

Measurements: 2.4 x 1.7 x 3.0 cm = volume: 6.5 mL. Normal morphology
without mass

Left ovary

Measurements: 3.0 x 1.8 x 2.6 cm = volume: 7.3 mL. Normal morphology
without mass

Other findings

No free pelvic fluid.  No adnexal masses.
IMPRESSION: Heterogeneous myometrium with ill-defined endometrial complex,
cannot exclude adenomyosis.

Remainder of exam unremarkable.

## 2022-11-17 ENCOUNTER — Encounter: Payer: Self-pay | Admitting: Nurse Practitioner

## 2022-11-17 ENCOUNTER — Ambulatory Visit (INDEPENDENT_AMBULATORY_CARE_PROVIDER_SITE_OTHER): Payer: No Typology Code available for payment source | Admitting: Nurse Practitioner

## 2022-11-17 VITALS — BP 128/64 | HR 72 | Resp 16 | Ht 70.0 in | Wt 241.0 lb

## 2022-11-17 DIAGNOSIS — N920 Excessive and frequent menstruation with regular cycle: Secondary | ICD-10-CM

## 2022-11-17 DIAGNOSIS — Z113 Encounter for screening for infections with a predominantly sexual mode of transmission: Secondary | ICD-10-CM | POA: Diagnosis not present

## 2022-11-17 DIAGNOSIS — Z3041 Encounter for surveillance of contraceptive pills: Secondary | ICD-10-CM | POA: Diagnosis not present

## 2022-11-17 DIAGNOSIS — Z01419 Encounter for gynecological examination (general) (routine) without abnormal findings: Secondary | ICD-10-CM

## 2022-11-17 DIAGNOSIS — N946 Dysmenorrhea, unspecified: Secondary | ICD-10-CM | POA: Diagnosis not present

## 2022-11-17 MED ORDER — NORETHIN ACE-ETH ESTRAD-FE 1-20 MG-MCG PO TABS: 1.0000 | ORAL_TABLET | Freq: Every day | ORAL | 3 refills | Status: DC

## 2022-11-17 NOTE — Progress Notes (Signed)
   Traci Dodson 05-05-1991 [id number]   History:  32 y.o. G1P1001 presents for annual exam. Monthly cycles. Started COCs November 2023 for heavy menses with good management. Normal pap history.   Gynecologic History Patient's last menstrual period was 11/01/2022. Period Cycle (Days): 28 Period Duration (Days): 4-5 Period Pattern: Regular Menstrual Flow: Moderate Menstrual Control: Maxi pad Menstrual Control Change Freq (Hours): 6-8 Dysmenorrhea: None Contraception/Family planning: OCP (estrogen/progesterone) Sexually active: Yes  Health Maintenance Last Pap: 05/25/2021. Results were: Normal, 3-year repeat Last mammogram: Not indicated Last colonoscopy: Not indicated Last Dexa: Not indicated:   Past medical history, past surgical history, family history and social history were all reviewed and documented in the EPIC chart. Boyfriend. Works in call center. 63 yo son, plays basketball.   ROS:  A ROS was performed and pertinent positives and negatives are included.  Exam:  Vitals:   11/17/22 0852  BP: 128/64  Pulse: 72  Resp: 16  Weight: 241 lb (109.3 kg)  Height: 5\' 10"  (1.778 m)   Body mass index is 34.58 kg/m.  General appearance:  Normal Thyroid:  Symmetrical, normal in size, without palpable masses or nodularity. Respiratory  Auscultation:  Clear without wheezing or rhonchi Cardiovascular  Auscultation:  Regular rate, without rubs, murmurs or gallops  Edema/varicosities:  Not grossly evident Abdominal  Soft,nontender, without masses, guarding or rebound.  Liver/spleen:  No organomegaly noted  Hernia:  None appreciated  Skin  Inspection:  Grossly normal Breasts: Examined lying and sitting.   Right: Without masses, retractions, nipple discharge or axillary adenopathy.   Left: Without masses, retractions, nipple discharge or axillary adenopathy. Genitourinary   Inguinal/mons:  Normal without inguinal adenopathy  External genitalia:  Normal appearing vulva with  no masses, tenderness, or lesions  BUS/Urethra/Skene's glands:  Normal  Vagina:  Normal appearing with normal color and discharge, no lesions  Cervix:  Normal appearing without discharge or lesions  Uterus:  Normal in size, shape and contour.  Midline and mobile, nontender  Adnexa/parametria:     Rt: Normal in size, without masses or tenderness.   Lt: Normal in size, without masses or tenderness.  Anus and perineum: Normal  Digital rectal exam: Deferred  Patient informed chaperone available to be present for breast and pelvic exam. Patient has requested no chaperone to be present. Patient has been advised what will be completed during breast and pelvic exam.   Assessment/Plan:  32 y.o. G1P1001 for annual exam.   Well female exam with routine gynecological exam - Education provided on SBEs, importance of preventative screenings, current guidelines, high calcium diet, regular exercise, and multivitamin daily.  Labs with PCP.   Encounter for surveillance of contraceptive pills - Plan: norethindrone-ethinyl estradiol-FE (LOESTRIN FE) 1-20 MG-MCG tablet daily. Taking as prescribed. Refill x 1 year provided.   Menorrhagia with regular cycle - Plan: norethindrone-ethinyl estradiol-FE (LOESTRIN FE) 1-20 MG-MCG tablet daily. Good management.   Screening examination for STD (sexually transmitted disease) - Plan: SURESWAB CT/NG/T. vaginalis, RPR, HIV Antibody (routine testing w rflx)  Dysmenorrhea - Plan: norethindrone-ethinyl estradiol-FE (LOESTRIN FE) 1-20 MG-MCG tablet daily. Good management.   Screening for cervical cancer - Normal Pap history.  Will repeat at 3-year interval per guidelines.  Return in 1 year for annual.     [name] DNP, 9:12 AM 11/17/2022

## 2022-11-18 LAB — RPR: RPR Ser Ql: NONREACTIVE

## 2022-11-18 LAB — HIV ANTIBODY (ROUTINE TESTING W REFLEX): HIV 1&2 Ab, 4th Generation: NONREACTIVE

## 2022-11-20 LAB — SURESWAB CT/NG/T. VAGINALIS
C. trachomatis RNA, TMA: NOT DETECTED
N. gonorrhoeae RNA, TMA: NOT DETECTED
Trichomonas vaginalis RNA: NOT DETECTED

## 2023-06-06 NOTE — Progress Notes (Incomplete)
Traci Dodson is a 32 y.o. female and is here for a comprehensive physical exam. HPI Health Maintenance Due  Topic Date Due   COVID-19 Vaccine (3 - 2023-24 season) 04/09/2023   No chief complaint on file.  Acute Concerns: {ExamConcerns:31114}  Chronic Issues: {ExamConcerns:31114}GERD: {ELStarters:31163} *** *** *** *** *** ***   Health Maintenance: Immunizations -- *** Colonoscopy-- N/A Mammogram-- N/A PAP-- Last done 05/25/22. Results were normal. Repeat 2025. Bone Density-- N/A Diet -- {CPE Diet/Exercise:30649} Exercise -- {CPE Diet/Exercise:30649}  Sleep habits -- {CPE Mood/Sleep:30650} Mood -- {CPE Mood/Sleep:30650}  UTD with dentist? - {Eye Doctor/Dentist:30651} UTD with eye doctor? - {Eye Doctor/Dentist:30651}  Weight history: Wt Readings from Last 10 Encounters:  11/17/22 241 lb (109.3 kg)  07/07/22 242 lb (109.8 kg)  06/02/22 240 lb 8 oz (109.1 kg)  11/08/21 232 lb (105.2 kg)  10/27/21 242 lb 8 oz (110 kg)  10/02/21 240 lb (108.9 kg)  05/25/21 243 lb 4 oz (110.3 kg)  02/04/14 206 lb (93.4 kg)  08/30/11 211 lb (95.7 kg) (98%, Z= 2.10)*   * Growth percentiles are based on CDC (Girls, 2-20 Years) data.   There is no height or weight on file to calculate BMI. No LMP recorded.  Alcohol use:  reports current alcohol use.  Tobacco use:  Tobacco Use: Low Risk  (11/17/2022)   Patient History    Smoking Tobacco Use: Never    Smokeless Tobacco Use: Never    Passive Exposure: Never   Eligible for lung cancer screening? ***     06/02/2022    9:36 AM  Depression screen PHQ 2/9  Decreased Interest 0  Down, Depressed, Hopeless 0  PHQ - 2 Score 0     Other providers/specialists: Patient Care Team: Jarold Motto, [place] as PCP - General (Physician Assistant) [name], NP as Nurse Practitioner (Gynecology)   PMHx, SurgHx, SocialHx, Medications, and Allergies were reviewed in the Visit Navigator and updated as appropriate.   Past Medical  History:  Diagnosis Date   GERD (gastroesophageal reflux disease)    Hyperlipemia    no meds   Vaginal delivery    2013    Past Surgical History:  Procedure Laterality Date   HERNIA REPAIR     in childhood   VENTRAL HERNIA REPAIR N/A 11/08/2021   Procedure: LAPAROSCOPIC VENTRAL HERNIA REPAIR;  Surgeon: Diamantina Monks, MD;  Location: [facility] OR;  Service: General;  Laterality: N/A;   WISDOM TOOTH EXTRACTION     Family History  Problem Relation Age of Onset   Diabetes Mother    Diabetes Maternal Grandmother    Diabetes Maternal Grandfather    Hyperlipidemia Paternal Grandmother    Breast cancer Neg Hx    Colon cancer Neg Hx    Social History   Tobacco Use   Smoking status: Never    Passive exposure: Never   Smokeless tobacco: Never  Vaping Use   Vaping status: Never Used  Substance Use Topics   Alcohol use: Yes    Comment: socially   Drug use: No   Review of Systems:   ROS See pertinent positives and negatives as per the HPI.  Objective:   There were no vitals taken for this visit. There is no height or weight on file to calculate BMI.   General Appearance:    Alert, cooperative, no distress, appears stated age  Head:    Normocephalic, without obvious abnormality, atraumatic  Eyes:    PERRL, conjunctiva/corneas clear, EOM's intact, fundi  benign, both eyes  Ears:    Normal TM's and external ear canals, both ears  Nose:   Nares normal, septum midline, mucosa normal, no drainage    or sinus tenderness  Throat:   Lips, mucosa, and tongue normal; teeth and gums normal  Neck:   Supple, symmetrical, trachea midline, no adenopathy;    thyroid:  no enlargement/tenderness/nodules; no carotid   bruit or JVD  Back:     Symmetric, no curvature, ROM normal, no CVA tenderness  Lungs:     Clear to auscultation bilaterally, respirations unlabored  Chest Wall:    No tenderness or deformity   Heart:    Regular rate and rhythm, S1 and S2 normal, no murmur, rub or gallop  Breast  Exam:    ***No tenderness, masses, or nipple abnormality  Abdomen:     Soft, non-tender, bowel sounds active all four quadrants,    no masses, no organomegaly  Genitalia:    ***Normal female without lesion, discharge or tenderness  Extremities:   Extremities normal, atraumatic, no cyanosis or edema  Pulses:   2+ and symmetric all extremities  Skin:   Skin color, texture, turgor normal, no rashes or lesions  Lymph nodes:   Cervical, supraclavicular, and axillary nodes normal  Neurologic:   CNII-XII intact, normal strength, sensation and reflexes    throughout    Assessment/Plan:   There are no diagnoses linked to this encounter.          I,Emily Lagle,acting as a Neurosurgeon for [employer], PA.,have documented all relevant documentation on the behalf of Jarold Motto, PA,as directed by  Jarold Motto, PA while in the presence of Jarold Motto, [place].  *** (refresh reminder)  I, Jarold Motto, PA, have reviewed all documentation for this visit. The documentation on 06/06/23 for the exam, diagnosis, procedures, and orders are all accurate and complete.  Jarold Motto, PA-C [place] [facility]

## 2023-06-08 ENCOUNTER — Encounter: Payer: No Typology Code available for payment source | Admitting: Physician Assistant

## 2023-07-24 ENCOUNTER — Ambulatory Visit: Payer: No Typology Code available for payment source | Admitting: Physician Assistant

## 2023-07-24 ENCOUNTER — Encounter: Payer: Self-pay | Admitting: Physician Assistant

## 2023-07-24 VITALS — BP 110/80 | HR 92 | Temp 97.5°F | Ht 70.0 in | Wt 242.0 lb

## 2023-07-24 DIAGNOSIS — M25532 Pain in left wrist: Secondary | ICD-10-CM | POA: Diagnosis not present

## 2023-07-24 DIAGNOSIS — Z0001 Encounter for general adult medical examination with abnormal findings: Secondary | ICD-10-CM

## 2023-07-24 DIAGNOSIS — Z0000 Encounter for general adult medical examination without abnormal findings: Secondary | ICD-10-CM

## 2023-07-24 DIAGNOSIS — Z6834 Body mass index (BMI) 34.0-34.9, adult: Secondary | ICD-10-CM | POA: Diagnosis not present

## 2023-07-24 DIAGNOSIS — E669 Obesity, unspecified: Secondary | ICD-10-CM | POA: Diagnosis not present

## 2023-07-24 LAB — CBC WITH DIFFERENTIAL/PLATELET
Basophils Absolute: 0 10*3/uL (ref 0.0–0.1)
Basophils Relative: 0.3 % (ref 0.0–3.0)
Eosinophils Absolute: 0.2 10*3/uL (ref 0.0–0.7)
Eosinophils Relative: 2 % (ref 0.0–5.0)
HCT: 37.2 % (ref 36.0–46.0)
Hemoglobin: 12.5 g/dL (ref 12.0–15.0)
Lymphocytes Relative: 33 % (ref 12.0–46.0)
Lymphs Abs: 3.6 10*3/uL (ref 0.7–4.0)
MCHC: 33.7 g/dL (ref 30.0–36.0)
MCV: 89.6 fL (ref 78.0–100.0)
Monocytes Absolute: 0.6 10*3/uL (ref 0.1–1.0)
Monocytes Relative: 5.9 % (ref 3.0–12.0)
Neutro Abs: 6.4 10*3/uL (ref 1.4–7.7)
Neutrophils Relative %: 58.8 % (ref 43.0–77.0)
Platelets: 359 10*3/uL (ref 150.0–400.0)
RBC: 4.16 Mil/uL (ref 3.87–5.11)
RDW: 12.7 % (ref 11.5–15.5)
WBC: 10.9 10*3/uL — ABNORMAL HIGH (ref 4.0–10.5)

## 2023-07-24 LAB — LIPID PANEL
Cholesterol: 176 mg/dL (ref 0–200)
HDL: 45.9 mg/dL (ref >39.00)
LDL Cholesterol: 103 mg/dL — ABNORMAL HIGH (ref 0–99)
NonHDL: 130.16
Total CHOL/HDL Ratio: 4
Triglycerides: 135 mg/dL (ref 0.0–149.0)
VLDL: 27 mg/dL (ref 0.0–40.0)

## 2023-07-24 LAB — COMPREHENSIVE METABOLIC PANEL
ALT: 14 U/L (ref 0–35)
AST: 19 U/L (ref 0–37)
Albumin: 3.7 g/dL (ref 3.5–5.2)
Alkaline Phosphatase: 49 U/L (ref 39–117)
BUN: 10 mg/dL (ref 6–23)
CO2: 23 meq/L (ref 19–32)
Calcium: 8.4 mg/dL (ref 8.4–10.5)
Chloride: 105 meq/L (ref 96–112)
Creatinine, Ser: 0.86 mg/dL (ref 0.40–1.20)
GFR: 90.03 mL/min (ref >60.00)
Glucose, Bld: 85 mg/dL (ref 70–99)
Potassium: 3.7 meq/L (ref 3.5–5.1)
Sodium: 135 meq/L (ref 135–145)
Total Bilirubin: 0.3 mg/dL (ref 0.2–1.2)
Total Protein: 6.7 g/dL (ref 6.0–8.3)

## 2023-07-24 LAB — HEMOGLOBIN A1C: Hgb A1c MFr Bld: 5.5 % (ref 4.6–6.5)

## 2023-07-24 NOTE — Progress Notes (Signed)
Traci Dodson is a 32 y.o. female and is here for a comprehensive physical exam.  HPI Chief Complaint  Patient presents with   Annual Exam   Fall    Pt fell 3 weeks ago and left arm is still hurting.   Acute Concerns: Fall: Reports that 3 weeks ago, following some snowfall, she slipped walking down her driveway, catching herself with her left hand. Endorses contusion along left arm where she impacted and wrist pain when applying pressure or carrying something above a certain weight. States that she wrapped her wrist the day of the fall, and recently took an Epsom salt bath for relief.  Chronic Issues: Fhx of Diabetes: Recheck A1c today.  Denies hypoglycemic or hyperglycemic episodes or symptoms.   Lab Results  Component Value Date   HGBA1C 5.5 05/25/2021   Health Maintenance: Immunizations -- UpToDate Colonoscopy -- N/A Mammogram -- N/A PAP -- Last done 05/25/21. Results were normal. Repeat 2025. Bone Density -- N/A Diet -- Healthy overall Exercise -- Has reduced activity since Sept, but overall is trying to exercise regularly.  Sleep habits -- Good sleep quality Mood -- Stable  UTD with eye doctor? - Yes UTD with dentist? - No  Weight history: Wt Readings from Last 10 Encounters:  07/24/23 242 lb (109.8 kg)  11/17/22 241 lb (109.3 kg)  07/07/22 242 lb (109.8 kg)  06/02/22 240 lb 8 oz (109.1 kg)  11/08/21 232 lb (105.2 kg)  10/27/21 242 lb 8 oz (110 kg)  10/02/21 240 lb (108.9 kg)  05/25/21 243 lb 4 oz (110.3 kg)  02/04/14 206 lb (93.4 kg)  08/30/11 211 lb (95.7 kg) (98%, Z= 2.10)*   * Growth percentiles are based on CDC (Girls, 2-20 Years) data.   Body mass index is 34.72 kg/m. Patient's last menstrual period was 07/12/2023 (exact date).  Alcohol use:  reports current alcohol use.  Tobacco use:  Tobacco Use: Low Risk  (07/24/2023)   Patient History    Smoking Tobacco Use: Never    Smokeless Tobacco Use: Never    Passive Exposure: Never   Eligible  for lung cancer screening? no     07/24/2023    1:15 PM  Depression screen PHQ 2/9  Decreased Interest 1  Down, Depressed, Hopeless 1  PHQ - 2 Score 2  Altered sleeping 0  Tired, decreased energy 0  Change in appetite 0  Feeling bad or failure about yourself  0  Trouble concentrating 0  Suicidal thoughts 0  PHQ-9 Score 2  Difficult doing work/chores Not difficult at all    Other providers/specialists: Patient Care Team: Jarold Motto, [place] as PCP - General (Physician Assistant) [name], NP as Nurse Practitioner (Gynecology)   PMHx, SurgHx, SocialHx, Medications, and Allergies were reviewed in the Visit Navigator and updated as appropriate.   Past Medical History:  Diagnosis Date   GERD (gastroesophageal reflux disease)    Hyperlipemia    no meds   Vaginal delivery    2013    Past Surgical History:  Procedure Laterality Date   HERNIA REPAIR     in childhood   VENTRAL HERNIA REPAIR N/A 11/08/2021   Procedure: LAPAROSCOPIC VENTRAL HERNIA REPAIR;  Surgeon: Diamantina Monks, MD;  Location: [facility] OR;  Service: General;  Laterality: N/A;   WISDOM TOOTH EXTRACTION     Family History  Problem Relation Age of Onset   Diabetes Mother    Diabetes Maternal Grandmother    Diabetes Maternal Grandfather    Hyperlipidemia Paternal  Grandmother    Breast cancer Neg Hx    Colon cancer Neg Hx    Social History   Tobacco Use   Smoking status: Never    Passive exposure: Never   Smokeless tobacco: Never  Vaping Use   Vaping status: Never Used  Substance Use Topics   Alcohol use: Yes    Comment: socially   Drug use: No   Review of Systems:   Review of Systems  Constitutional:  Negative for chills, fever, malaise/fatigue and weight loss.  HENT:  Negative for hearing loss, sinus pain and sore throat.   Respiratory:  Negative for cough and hemoptysis.   Cardiovascular:  Negative for chest pain, palpitations, leg swelling and PND.  Gastrointestinal:  Negative for  abdominal pain, constipation, diarrhea, heartburn, nausea and vomiting.  Genitourinary:  Negative for dysuria, frequency and urgency.  Musculoskeletal:  Negative for back pain, myalgias and neck pain.  Skin:  Negative for itching and rash.  Neurological:  Negative for dizziness, tingling, seizures and headaches.  Endo/Heme/Allergies:  Negative for polydipsia.  Psychiatric/Behavioral:  Negative for depression. The patient is not nervous/anxious.       Objective:   BP 110/80 (BP Location: Left Arm, Patient Position: Sitting, Cuff Size: Large)   Pulse 92   Temp (!) 97.5 F (36.4 C) (Temporal)   Ht 5\' 10"  (1.778 m)   Wt 242 lb (109.8 kg)   LMP 07/12/2023 (Exact Date)   SpO2 97%   BMI 34.72 kg/m  Body mass index is 34.72 kg/m.   General Appearance:    Alert, cooperative, no distress, appears stated age  Head:    Normocephalic, without obvious abnormality, atraumatic  Eyes:    PERRL, conjunctiva/corneas clear, EOM's intact, fundi    benign, both eyes  Ears:    Normal TM's and external ear canals, both ears  Nose:   Nares normal, septum midline, mucosa normal, no drainage    or sinus tenderness  Throat:   Lips, mucosa, and tongue normal; teeth and gums normal  Neck:   Supple, symmetrical, trachea midline, no adenopathy;    thyroid:  no enlargement/tenderness/nodules; no carotid   bruit or JVD  Back:     Symmetric, no curvature, ROM normal, no CVA tenderness  Lungs:     Clear to auscultation bilaterally, respirations unlabored  Chest Wall:    No tenderness or deformity   Heart:    Regular rate and rhythm, S1 and S2 normal, no murmur, rub or gallop  Breast Exam:    Deferred  Abdomen:     Soft, non-tender, bowel sounds active all four quadrants,    no masses, no organomegaly  Genitalia:    Deferred  Extremities:   Extremities normal, atraumatic, no cyanosis or edema Left wrist without snuffbox tenderness; normal range of motion   Pulses:   2+ and symmetric all extremities  Skin:    Skin color, texture, turgor normal, no rashes or lesions  Lymph nodes:   Cervical, supraclavicular, and axillary nodes normal  Neurologic:   CNII-XII intact, normal strength, sensation and reflexes    throughout    Assessment/Plan:   Routine physical examination Today patient counseled on age appropriate routine health concerns for screening and prevention, each reviewed and up to date or declined. Immunizations reviewed and up to date or declined. Labs ordered and reviewed. Risk factors for depression reviewed and negative. Hearing function and visual acuity are intact. ADLs screened and addressed as needed. Functional ability and level of safety reviewed and  appropriate. Education, counseling and referrals performed based on assessed risks today. Patient provided with a copy of personalized plan for preventive services.  Left wrist pain Will order xray for further evaluation No obvious deformities/abnormalities on my exam Follow-up based on results and clinical symptom(s) -- consider sports medicine vs occupational therapy   Obesity, unspecified class, unspecified obesity type, unspecified whether serious comorbidity present Continue efforts at healthy lifestyle   I,Emily Lagle,acting as a scribe for [employer], PA.,have documented all relevant documentation on the behalf of Jarold Motto, PA,as directed by  Jarold Motto, PA while in the presence of Jarold Motto, [place].   I, Jarold Motto, [place], have reviewed all documentation for this visit. The documentation on 07/24/23 for the exam, diagnosis, procedures, and orders are all accurate and complete.  Jarold Motto, PA-C [facility]

## 2023-07-24 NOTE — Patient Instructions (Signed)
It was great to see you!  An order for xray has been put in for you. To have this done, you can walk in at the [facility] location without a scheduled appointment.  The address is [address] It is across the street from [facility]. xray are located in the basement.   Hours of operation are M-F 8:30am to 5:00pm.  Please note that they are closed for lunch between 12:30 and 1:00pm.   Please go to the lab for blood work.   Our office will call you with your results unless you have chosen to receive results via MyChart.  If your blood work is normal we will follow-up each year for physicals and as scheduled for chronic medical problems.  If anything is abnormal we will treat accordingly and get you in for a follow-up.  Take care,  Lelon Mast

## 2023-07-26 ENCOUNTER — Encounter: Payer: Self-pay | Admitting: Physician Assistant

## 2023-07-26 DIAGNOSIS — D72829 Elevated white blood cell count, unspecified: Secondary | ICD-10-CM

## 2023-11-20 ENCOUNTER — Other Ambulatory Visit (HOSPITAL_COMMUNITY)
Admission: RE | Admit: 2023-11-20 | Discharge: 2023-11-20 | Disposition: A | Discharge status: Home / Self Care | Source: Ambulatory Visit | Attending: Nurse Practitioner | Admitting: Nurse Practitioner

## 2023-11-20 ENCOUNTER — Ambulatory Visit (INDEPENDENT_AMBULATORY_CARE_PROVIDER_SITE_OTHER): Payer: No Typology Code available for payment source | Admitting: Nurse Practitioner

## 2023-11-20 ENCOUNTER — Encounter: Payer: Self-pay | Admitting: Nurse Practitioner

## 2023-11-20 VITALS — BP 120/88 | HR 89 | Ht 71.5 in | Wt 242.0 lb

## 2023-11-20 DIAGNOSIS — N920 Excessive and frequent menstruation with regular cycle: Secondary | ICD-10-CM | POA: Diagnosis not present

## 2023-11-20 DIAGNOSIS — Z01419 Encounter for gynecological examination (general) (routine) without abnormal findings: Secondary | ICD-10-CM | POA: Diagnosis not present

## 2023-11-20 DIAGNOSIS — Z3041 Encounter for surveillance of contraceptive pills: Secondary | ICD-10-CM

## 2023-11-20 DIAGNOSIS — Z1331 Encounter for screening for depression: Secondary | ICD-10-CM | POA: Diagnosis not present

## 2023-11-20 DIAGNOSIS — Z124 Encounter for screening for malignant neoplasm of cervix: Secondary | ICD-10-CM | POA: Insufficient documentation

## 2023-11-20 DIAGNOSIS — N946 Dysmenorrhea, unspecified: Secondary | ICD-10-CM | POA: Diagnosis not present

## 2023-11-20 MED ORDER — NORETHIN ACE-ETH ESTRAD-FE 1-20 MG-MCG PO TABS: 1.0000 | ORAL_TABLET | Freq: Every day | ORAL | 3 refills | Status: AC

## 2023-11-20 NOTE — Progress Notes (Signed)
 Traci Dodson [birth year] [id number]   History:  33 y.o. G1P1001 presents for annual exam. Monthly cycles. COCs for heavy, painful periods with good management. Normal pap history.   Gynecologic History Patient's last menstrual period was 10/30/2023 (approximate). Period Duration (Days): 4-5 Period Pattern: (!) Irregular Menstrual Flow: Moderate Menstrual Control: Maxi pad Dysmenorrhea: None Contraception/Family planning: OCP (estrogen/progesterone) Sexually active: Yes  Health Maintenance Last Pap: 05/25/2021. Results were: Normal Last mammogram: Not indicated Last colonoscopy: Not indicated Last Dexa: Not indicated:   Flowsheet Row Office Visit from 11/20/2023 in [facility]  PHQ-2 Total Score 2        Past medical history, past surgical history, family history and social history were all reviewed and documented in the EPIC chart. Boyfriend. Works in call center for [employer]. 31 yo son, plays basketball.   ROS:  A ROS was performed and pertinent positives and negatives are included.  Exam:  Vitals:   11/20/23 1502  BP: 120/88  Pulse: 89  SpO2: 97%  Weight: 242 lb (109.8 kg)  Height: 5' 11.5" (1.816 m)    Body mass index is 33.28 kg/m.  General appearance:  Normal Thyroid:  Symmetrical, normal in size, without palpable masses or nodularity. Respiratory  Auscultation:  Clear without wheezing or rhonchi Cardiovascular  Auscultation:  Regular rate, without rubs, murmurs or gallops  Edema/varicosities:  Not grossly evident Abdominal  Soft,nontender, without masses, guarding or rebound.  Liver/spleen:  No organomegaly noted  Hernia:  None appreciated  Skin  Inspection:  Grossly normal Breasts: Examined lying and sitting.   Right: Without masses, retractions, nipple discharge or axillary adenopathy.   Left: Without masses, retractions, nipple discharge or axillary adenopathy. Pelvic: External genitalia:  no lesions               Urethra:  normal appearing urethra with no masses, tenderness or lesions              Bartholins and Skenes: normal                 Vagina: normal appearing vagina with normal color and discharge, no lesions              Cervix: no lesions Bimanual Exam:  Uterus:  no masses or tenderness              Adnexa: no mass, fullness, tenderness              Rectovaginal: Deferred              Anus:  normal, no lesions  Patient informed chaperone available to be present for breast and pelvic exam. Patient has requested no chaperone to be present. Patient has been advised what will be completed during breast and pelvic exam.   Assessment/Plan:  33 y.o. G1P1001 for annual exam.   Well female exam with routine gynecological exam - Education provided on SBEs, importance of preventative screenings, current guidelines, high calcium diet, regular exercise, and multivitamin daily.  Labs with PCP.   Encounter for surveillance of contraceptive pills - Plan: norethindrone-ethinyl estradiol-FE (LOESTRIN FE) 1-20 MG-MCG tablet daily. Taking as prescribed. Refill x 1 year provided.   Menorrhagia with regular cycle - Plan: norethindrone-ethinyl estradiol-FE (LOESTRIN FE) 1-20 MG-MCG tablet daily. Good management.   Dysmenorrhea - Plan: norethindrone-ethinyl estradiol-FE (LOESTRIN FE) 1-20 MG-MCG tablet daily. Good management.   Cervical cancer screening - Plan: Cytology - PAP( [place]). Normal Pap history.  Will repeat at 3-year interval per  guidelines.  Return in about 1 year (around 11/19/2024) for Annual.    Andee Bamberger DNP, 3:19 PM 11/20/2023

## 2023-11-24 LAB — CYTOLOGY - PAP
Comment: NEGATIVE
Diagnosis: NEGATIVE
Diagnosis: REACTIVE
High risk HPV: NEGATIVE

## 2023-11-26 ENCOUNTER — Encounter: Payer: Self-pay | Admitting: Nurse Practitioner

## 2024-07-29 ENCOUNTER — Encounter: Payer: No Typology Code available for payment source | Admitting: Physician Assistant

## 2024-09-18 ENCOUNTER — Encounter: Admitting: Physician Assistant

## 2024-11-25 ENCOUNTER — Ambulatory Visit: Admitting: Nurse Practitioner
